# Patient Record
Sex: Female | Born: 1970
Health system: Southern US, Community
[De-identification: ages and names within clinical notes are randomized; demographics above are authoritative.]

## PROBLEM LIST (undated history)

## (undated) DIAGNOSIS — M349 Systemic sclerosis, unspecified: Secondary | ICD-10-CM

## (undated) DIAGNOSIS — I639 Cerebral infarction, unspecified: Secondary | ICD-10-CM

## (undated) DIAGNOSIS — I10 Essential (primary) hypertension: Secondary | ICD-10-CM

## (undated) HISTORY — PX: CHOLECYSTECTOMY: SHX55

---

## 2009-03-13 ENCOUNTER — Emergency Department (HOSPITAL_BASED_OUTPATIENT_CLINIC_OR_DEPARTMENT_OTHER): Admission: EM | Admit: 2009-03-13 | Discharge: 2009-03-13 | Payer: Self-pay | Admitting: Emergency Medicine

## 2009-03-13 ENCOUNTER — Ambulatory Visit: Payer: Self-pay | Admitting: Diagnostic Radiology

## 2011-10-19 ENCOUNTER — Encounter (HOSPITAL_BASED_OUTPATIENT_CLINIC_OR_DEPARTMENT_OTHER): Payer: Self-pay | Admitting: *Deleted

## 2011-10-19 ENCOUNTER — Emergency Department (HOSPITAL_BASED_OUTPATIENT_CLINIC_OR_DEPARTMENT_OTHER)
Admission: EM | Admit: 2011-10-19 | Discharge: 2011-10-20 | Disposition: A | Discharge status: Home / Self Care | Payer: BC Managed Care – PPO | Attending: Emergency Medicine | Admitting: Emergency Medicine

## 2011-10-19 DIAGNOSIS — Z79899 Other long term (current) drug therapy: Secondary | ICD-10-CM | POA: Insufficient documentation

## 2011-10-19 DIAGNOSIS — I10 Essential (primary) hypertension: Secondary | ICD-10-CM | POA: Insufficient documentation

## 2011-10-19 DIAGNOSIS — R609 Edema, unspecified: Secondary | ICD-10-CM | POA: Insufficient documentation

## 2011-10-19 DIAGNOSIS — IMO0002 Reserved for concepts with insufficient information to code with codable children: Secondary | ICD-10-CM | POA: Insufficient documentation

## 2011-10-19 DIAGNOSIS — G44209 Tension-type headache, unspecified, not intractable: Secondary | ICD-10-CM | POA: Insufficient documentation

## 2011-10-19 DIAGNOSIS — M349 Systemic sclerosis, unspecified: Secondary | ICD-10-CM | POA: Insufficient documentation

## 2011-10-19 DIAGNOSIS — E119 Type 2 diabetes mellitus without complications: Secondary | ICD-10-CM | POA: Insufficient documentation

## 2011-10-19 HISTORY — DX: Essential (primary) hypertension: I10

## 2011-10-19 HISTORY — DX: Systemic sclerosis, unspecified: M34.9

## 2011-10-19 MED ORDER — DIPHENHYDRAMINE HCL 50 MG/ML IJ SOLN
25.0000 mg | Freq: Once | INTRAMUSCULAR | Status: AC
  Administered 2011-10-20: 25 mg via INTRAVENOUS
  Filled 2011-10-19: qty 1

## 2011-10-19 MED ORDER — METOCLOPRAMIDE HCL 5 MG/ML IJ SOLN
10.0000 mg | Freq: Once | INTRAMUSCULAR | Status: AC
  Administered 2011-10-20: 10 mg via INTRAVENOUS
  Filled 2011-10-19: qty 2

## 2011-10-19 NOTE — ED Notes (Signed)
CBG 171 reported this to MD

## 2011-10-19 NOTE — ED Notes (Signed)
Pt seen at [facility] last night for nosebleed- today c/o headache and elevated b/p

## 2011-10-19 NOTE — ED Provider Notes (Signed)
History  This chart was scribed for Dione Booze, MD by Cherlynn Perches. The patient was seen in room MH01/MH01. Patient's care was started at 2327.  CSN: [id number]  Arrival date & time 10/19/11  2327   First MD Initiated Contact with Patient 10/19/11 2340      Chief Complaint  Patient presents with  . Headache    (Consider location/radiation/quality/duration/timing/severity/associated sxs/prior treatment) HPI  Jane Sharp is a 41 y.o. female with a h/o HTN, sleroderma, and diabetes who presents to the Emergency Department complaining of 24 hours of gradual onset, gradually worsening, constant, "6/10" headache localized to the left front of the head and described as achy with associated neck pain. Pt reports that she was sleeping last night and felt congested. When she got up to blow her nose, she noticed a blood clot and began having a bloody nose. She visited [facility] ED for treatment. After bleeding was controlled, pt states that she developed a headache, which has been worsening over the day today. Pt has not taken any pain medications for HA. Pt states that she is non-compliant with her HTN and diabetes management. Pt reports that she takes her HTN medication about 3 times/week and checks her glucose 1 time/week. Pt denies blurred vision, nausea, vomiting, diaphoresis, and fever.   PCP - Dr. Caffie Damme in [place]  Past Medical History  Diagnosis Date  . Hypertension   . Diabetes mellitus   . Scleroderma     Past Surgical History  Procedure Date  . Cholecystectomy     No family history on file.  History  Substance Use Topics  . Smoking status: Never Smoker   . Smokeless tobacco: Never Used  . Alcohol Use: No    OB History    Grav Para Term Preterm Abortions TAB SAB Ect Mult Living                  Review of Systems  Constitutional: Negative for fever and chills.  HENT: Positive for nosebleeds (last night) and neck pain. Negative for sore throat.     Respiratory: Negative for cough and shortness of breath.   Cardiovascular: Negative for chest pain.  Gastrointestinal: Negative for nausea, vomiting and diarrhea.  Genitourinary: Negative for dysuria and hematuria.  Neurological: Positive for headaches. Negative for seizures and syncope.  All other systems reviewed and are negative.    Allergies  Review of patient's allergies indicates no known allergies.  Home Medications   Current Outpatient Rx  Name Route Sig Dispense Refill  . ATORVASTATIN CALCIUM 20 MG PO TABS Oral Take 20 mg by mouth.    Marland Kitchen LISINOPRIL-HYDROCHLOROTHIAZIDE 20-12.5 MG PO TABS Oral Take 1 tablet by mouth.    . METFORMIN HCL 500 MG PO TABS Oral Take 500 mg by mouth.    Marland Kitchen PREDNISONE 2.5 MG PO TABS Oral Take 2.5 mg by mouth.    Marland Kitchen SITAGLIPTIN PHOSPHATE 100 MG PO TABS Oral Take 100 mg by mouth.      Triage Vitals: BP 177/111  Pulse 89  Temp(Src) 99.3 F (37.4 C) (Oral)  Resp 18  Ht 5' (1.524 m)  Wt 285 lb (129.275 kg)  BMI 55.66 kg/m2  SpO2 99%  LMP 09/19/2011  Physical Exam  Nursing note and vitals reviewed. Constitutional: She is oriented to person, place, and time. She appears well-developed and well-nourished.  HENT:  Head: Normocephalic and atraumatic.       Tenderness of left temporalis and paracervical muscles   Eyes: Conjunctivae  and EOM are normal. Pupils are equal, round, and reactive to light. No scleral icterus.       funduscopic exam is normal  Neck: Normal range of motion. Neck supple.  Cardiovascular: Normal rate and regular rhythm.   Pulmonary/Chest: Effort normal. No respiratory distress.  Abdominal: Soft. Bowel sounds are normal.  Musculoskeletal: Normal range of motion. She exhibits edema (1+ edema).  Neurological: She is alert and oriented to person, place, and time.  Skin: Skin is warm and dry.       Sclerotic changes of scleroderma   Psychiatric: She has a normal mood and affect. Her behavior is normal.    ED Course   Procedures (including critical care time)  DIAGNOSTIC STUDIES: Oxygen Saturation is 99% on room air, normal by my interpretation.    COORDINATION OF CARE: 11:49PM - Will get IV started and try to get HA under control. Advised pt of importance of compliance to medications and treatments for HTN and diabetes. Pt agrees with plan.    Results for orders placed during the hospital encounter of 10/19/11  CBC      Component Value Range   WBC 10.2  4.0 - 10.5 (K/uL)   RBC 5.27 (*) 3.87 - 5.11 (MIL/uL)   Hemoglobin 12.4  12.0 - 15.0 (g/dL)   HCT 86.5  78.4 - 69.6 (%)   MCV 72.5 (*) 78.0 - 100.0 (fL)   MCH 23.5 (*) 26.0 - 34.0 (pg)   MCHC 32.5  30.0 - 36.0 (g/dL)   RDW 29.5 (*) 28.4 - 15.5 (%)   Platelets 332  150 - 400 (K/uL)  DIFFERENTIAL      Component Value Range   Neutrophils Relative 35 (*) 43 - 77 (%)   Neutro Abs 3.5  1.7 - 7.7 (K/uL)   Lymphocytes Relative 55 (*) 12 - 46 (%)   Lymphs Abs 5.6 (*) 0.7 - 4.0 (K/uL)   Monocytes Relative 7  3 - 12 (%)   Monocytes Absolute 0.7  0.1 - 1.0 (K/uL)   Eosinophils Relative 4  0 - 5 (%)   Eosinophils Absolute 0.4  0.0 - 0.7 (K/uL)   Basophils Relative 0  0 - 1 (%)   Basophils Absolute 0.0  0.0 - 0.1 (K/uL)  BASIC METABOLIC PANEL      Component Value Range   Sodium 135  135 - 145 (mEq/L)   Potassium 4.2  3.5 - 5.1 (mEq/L)   Chloride 99  96 - 112 (mEq/L)   CO2 28  19 - 32 (mEq/L)   Glucose, Bld 171 (*) 70 - 99 (mg/dL)   BUN 12  6 - 23 (mg/dL)   Creatinine, Ser 1.32  0.50 - 1.10 (mg/dL)   Calcium 9.3  8.4 - 44.0 (mg/dL)   GFR calc non Af Amer 78 (*) >90 (mL/min)   GFR calc Af Amer >90  >90 (mL/min)    1. Tension headache       MDM  Headache which seems most likely to be muscle contraction headache. Hypertension. She'll be given a headache cocktail and reassessed.  After IV metoclopramide and IV diphenhydramine, headache is much better and blood pressure was back to normal. Patient was encouraged to monitor her blood pressure  and blood sugar at home. She is sent home with a prescription for metoclopramide.      I personally performed the services described in this documentation, which was scribed in my presence. The recorded information has been reviewed and considered.  Dione Booze, MD 10/20/11 [phone]

## 2011-10-20 LAB — GLUCOSE, CAPILLARY: Glucose-Capillary: 171 mg/dL — ABNORMAL HIGH (ref 70–99)

## 2011-10-20 LAB — BASIC METABOLIC PANEL
Chloride: 99 mEq/L (ref 96–112)
GFR calc non Af Amer: 78 mL/min — ABNORMAL LOW (ref >90)
Glucose, Bld: 171 mg/dL — ABNORMAL HIGH (ref 70–99)

## 2011-10-20 LAB — CBC
HCT: 38.2 % (ref 36.0–46.0)
MCH: 23.5 pg — ABNORMAL LOW (ref 26.0–34.0)
MCHC: 32.5 g/dL (ref 30.0–36.0)
MCV: 72.5 fL — ABNORMAL LOW (ref 78.0–100.0)
Platelets: 332 10*3/uL (ref 150–400)
RBC: 5.27 MIL/uL — ABNORMAL HIGH (ref 3.87–5.11)
RDW: 16.7 % — ABNORMAL HIGH (ref 11.5–15.5)

## 2011-10-20 LAB — DIFFERENTIAL
Eosinophils Absolute: 0.4 10*3/uL (ref 0.0–0.7)
Eosinophils Relative: 4 % (ref 0–5)
Lymphs Abs: 5.6 10*3/uL — ABNORMAL HIGH (ref 0.7–4.0)
Monocytes Absolute: 0.7 10*3/uL (ref 0.1–1.0)
Monocytes Relative: 7 % (ref 3–12)
Neutro Abs: 3.5 10*3/uL (ref 1.7–7.7)
Neutrophils Relative %: 35 % — ABNORMAL LOW (ref 43–77)

## 2011-10-20 MED ORDER — METOCLOPRAMIDE HCL 10 MG PO TABS: 10.0000 mg | ORAL_TABLET | Freq: Four times a day (QID) | ORAL | Status: DC | PRN

## 2011-10-20 NOTE — Discharge Instructions (Signed)
Monitor your blood sugar and her blood pressure at home on a regular basis. Bring a log of your blood pressure and blood sugar to your Dr. when you ago for visits.  Tension Headache (Muscle Contraction Headache) Tension headache is one of the most common causes of head pain. These headaches are usually felt as a pain over the top of your head and back of your neck. Stress, anxiety, and depression are common triggers for these headaches. Tension headaches are not life-threatening and will not lead to other types of headaches. Tension headaches can often be diagnosed by taking a history from the patient and a physical exam. Sometimes, further lab and x-ray studies are used to confirm the diagnosis. Your caregiver can advise you on how to get help solving problems that cause anxiety or stress. Antidepressants can be prescribed if depression is a problem. HOME CARE INSTRUCTIONS   If testing was done, call for your results. Remember, it is your responsibility to get the results of all testing. Do not assume everything is fine because you do not hear from your caregiver.   Only take over-the-counter or prescription medicines for pain, discomfort, or fever as directed by your caregiver.   Biofeedback, massage, or other relaxation techniques may be helpful.   Ice packs or heat to the head and neck can be used. Use these three to four times per day or as needed.   Physical therapy may be a useful addition to treatment.   If headaches continue, even with therapy, you may need to think about lifestyle changes.   Avoid excessive use of pain killers, as rebound headaches can occur.  SEEK MEDICAL CARE IF:   You develop problems with medications prescribed.   You do not respond or get no relief from medications.   You have a change from the usual headache.   You develop nausea (feeling sick to your stomach) or vomiting.  SEEK IMMEDIATE MEDICAL CARE IF:   Your headache becomes severe.   You have an  unexplained oral temperature above 102 F (38.9 C).   You develop a stiff neck.   You have loss of vision.   You have muscular weakness.   You have loss of muscular control.   You develop severe symptoms different from your first symptoms.   You start losing your balance or have trouble walking.   You feel faint or pass out.  MAKE SURE YOU:   Understand these instructions.   Will watch your condition.   Will get help right away if you are not doing well or get worse.  Document Released: 05/11/2005 Document Revised: 04/30/2011 Document Reviewed: 12/29/2007 [facility] Patient Information 2012 [place], [place].  Metoclopramide tablets What is this medicine? METOCLOPRAMIDE (met oh kloe PRA mide) is used to treat the symptoms of gastroesophageal reflux disease (GERD) like heartburn. It is also used to treat people with slow emptying of the stomach and intestinal tract. This medicine may be used for other purposes; ask your health care provider or pharmacist if you have questions. What should I tell my health care provider before I take this medicine? They need to know if you have any of these conditions: -breast cancer -depression -diabetes -heart failure -high blood pressure -kidney disease -liver disease -Parkinson's disease or a movement disorder -pheochromocytoma -seizures -stomach obstruction, bleeding, or perforation -an unusual or allergic reaction to metoclopramide, procainamide, sulfites, other medicines, foods, dyes, or preservatives -pregnant or trying to get pregnant -breast-feeding How should I use this medicine? Take this  medicine by mouth with a glass of water. Follow the directions on the prescription label. Take this medicine on an empty stomach, about 30 minutes before eating. Take your doses at regular intervals. Do not take your medicine more often than directed. Do not stop taking except on the advice of your doctor or health care professional. A special  MedGuide will be given to you by the pharmacist with each prescription and refill. Be sure to read this information carefully each time. Talk to your pediatrician regarding the use of this medicine in children. Special care may be needed. Overdosage: If you think you have taken too much of this medicine contact a poison control center or emergency room at once. NOTE: This medicine is only for you. Do not share this medicine with others. What if I miss a dose? If you miss a dose, take it as soon as you can. If it is almost time for your next dose, take only that dose. Do not take double or extra doses. What may interact with this medicine? -acetaminophen -cyclosporine -digoxin -medicines for blood pressure -medicines for diabetes, including insulin -medicines for hay fever and other allergies -medicines for depression, especially an Monoamine Oxidase Inhibitor (MAOI) -medicines for Parkinson's disease, like levodopa -medicines for sleep or for pain -tetracycline This list may not describe all possible interactions. Give your health care provider a list of all the medicines, herbs, non-prescription drugs, or dietary supplements you use. Also tell them if you smoke, drink alcohol, or use illegal drugs. Some items may interact with your medicine. What should I watch for while using this medicine? It may take a few weeks for your stomach condition to start to get better. However, do not take this medicine for longer than 12 weeks. The longer you take this medicine, and the more you take it, the greater your chances are of developing serious side effects. If you are an elderly patient, a female patient, or you have diabetes, you may be at an increased risk for side effects from this medicine. Contact your doctor immediately if you start having movements you cannot control such as lip smacking, rapid movements of the tongue, involuntary or uncontrollable movements of the eyes, head, arms and legs, or  muscle twitches and spasms. Patients and their families should watch out for worsening depression or thoughts of suicide. Also watch out for any sudden or severe changes in feelings such as feeling anxious, agitated, panicky, irritable, hostile, aggressive, impulsive, severely restless, overly excited and hyperactive, or not being able to sleep. If this happens, especially at the beginning of treatment or after a change in dose, call your doctor. Do not treat yourself for high fever. Ask your doctor or health care professional for advice. You may get drowsy or dizzy. Do not drive, use machinery, or do anything that needs mental alertness until you know how this drug affects you. Do not stand or sit up quickly, especially if you are an older patient. This reduces the risk of dizzy or fainting spells. Alcohol can make you more drowsy and dizzy. Avoid alcoholic drinks. What side effects may I notice from receiving this medicine? Side effects that you should report to your doctor or health care professional as soon as possible: -allergic reactions like skin rash, itching or hives, swelling of the face, lips, or tongue -abnormal production of milk in females -breast enlargement in both males and females -change in the way you walk -difficulty moving, speaking or swallowing -drooling, lip smacking, or rapid  movements of the tongue -excessive sweating -fever -involuntary or uncontrollable movements of the eyes, head, arms and legs -irregular heartbeat or palpitations -muscle twitches and spasms -unusually weak or tired Side effects that usually do not require medical attention (report to your doctor or health care professional if they continue or are bothersome): -change in sex drive or performance -depressed mood -diarrhea -difficulty sleeping -headache -menstrual changes -restless or nervous This list may not describe all possible side effects. Call your doctor for medical advice about side  effects. You may report side effects to FDA at 1-800-FDA-1088. Where should I keep my medicine? Keep out of the reach of children. Store at room temperature between 20 and 25 degrees C (68 and 77 degrees F). Protect from light. Keep container tightly closed. Throw away any unused medicine after the expiration date. NOTE: This sheet is a summary. It may not cover all possible information. If you have questions about this medicine, talk to your doctor, pharmacist, or health care provider.  2012, [employer]vier/[employer] Standard. (01/04/2008 4:30:05 PM)

## 2011-10-20 NOTE — ED Notes (Signed)
Pt c/o upper part of mouth in back tingling and stomach aching pass medication given.

## 2013-04-14 ENCOUNTER — Emergency Department (HOSPITAL_BASED_OUTPATIENT_CLINIC_OR_DEPARTMENT_OTHER)
Admission: EM | Admit: 2013-04-14 | Discharge: 2013-04-14 | Disposition: A | Discharge status: Other Acute Inpatient Hospital | Payer: BC Managed Care – PPO | Attending: Emergency Medicine | Admitting: Emergency Medicine

## 2013-04-14 ENCOUNTER — Encounter (HOSPITAL_BASED_OUTPATIENT_CLINIC_OR_DEPARTMENT_OTHER): Payer: Self-pay | Admitting: Emergency Medicine

## 2013-04-14 ENCOUNTER — Emergency Department (HOSPITAL_BASED_OUTPATIENT_CLINIC_OR_DEPARTMENT_OTHER): Payer: BC Managed Care – PPO

## 2013-04-14 DIAGNOSIS — Z79899 Other long term (current) drug therapy: Secondary | ICD-10-CM | POA: Insufficient documentation

## 2013-04-14 DIAGNOSIS — I10 Essential (primary) hypertension: Secondary | ICD-10-CM | POA: Insufficient documentation

## 2013-04-14 DIAGNOSIS — I639 Cerebral infarction, unspecified: Secondary | ICD-10-CM

## 2013-04-14 DIAGNOSIS — I6350 Cerebral infarction due to unspecified occlusion or stenosis of unspecified cerebral artery: Secondary | ICD-10-CM | POA: Insufficient documentation

## 2013-04-14 DIAGNOSIS — IMO0002 Reserved for concepts with insufficient information to code with codable children: Secondary | ICD-10-CM | POA: Insufficient documentation

## 2013-04-14 DIAGNOSIS — E669 Obesity, unspecified: Secondary | ICD-10-CM | POA: Insufficient documentation

## 2013-04-14 DIAGNOSIS — E119 Type 2 diabetes mellitus without complications: Secondary | ICD-10-CM | POA: Insufficient documentation

## 2013-04-14 DIAGNOSIS — M349 Systemic sclerosis, unspecified: Secondary | ICD-10-CM | POA: Insufficient documentation

## 2013-04-14 LAB — CBC WITH DIFFERENTIAL/PLATELET
Basophils Absolute: 0 10*3/uL (ref 0.0–0.1)
Basophils Relative: 0 % (ref 0–1)
Eosinophils Relative: 4 % (ref 0–5)
Lymphs Abs: 4 10*3/uL (ref 0.7–4.0)
MCH: 23.3 pg — ABNORMAL LOW (ref 26.0–34.0)
MCHC: 31.3 g/dL (ref 30.0–36.0)
Neutro Abs: 4.3 10*3/uL (ref 1.7–7.7)
Neutrophils Relative %: 46 % (ref 43–77)
RDW: 16.1 % — ABNORMAL HIGH (ref 11.5–15.5)

## 2013-04-14 LAB — COMPREHENSIVE METABOLIC PANEL
ALT: 14 U/L (ref 0–35)
Alkaline Phosphatase: 73 U/L (ref 39–117)
CO2: 30 mEq/L (ref 19–32)
Calcium: 9.5 mg/dL (ref 8.4–10.5)
Chloride: 97 mEq/L (ref 96–112)
GFR calc Af Amer: >90 mL/min (ref >90)
GFR calc non Af Amer: 88 mL/min — ABNORMAL LOW (ref >90)
Potassium: 3.7 mEq/L (ref 3.5–5.1)
Sodium: 137 mEq/L (ref 135–145)

## 2013-04-14 LAB — URINALYSIS, ROUTINE W REFLEX MICROSCOPIC
Hgb urine dipstick: NEGATIVE
Ketones, ur: NEGATIVE mg/dL
Nitrite: NEGATIVE
Urobilinogen, UA: 0.2 mg/dL (ref 0.0–1.0)

## 2013-04-14 LAB — URINE MICROSCOPIC-ADD ON

## 2013-04-14 MED ORDER — ASPIRIN 81 MG PO CHEW
324.0000 mg | CHEWABLE_TABLET | Freq: Once | ORAL | Status: AC
  Administered 2013-04-14: 324 mg via ORAL
  Filled 2013-04-14: qty 4

## 2013-04-14 NOTE — ED Notes (Signed)
Patient transported to CT 
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
## 2013-04-14 NOTE — ED Notes (Signed)
Patient is removing  Hair pins from hair before she has cat scan.

## 2013-04-14 NOTE — ED Notes (Signed)
Assigned to bed 606 @ [facility] per nursing supervisor Jasmine December, RN notified, [facility] called for transport.

## 2013-04-14 NOTE — ED Notes (Signed)
Pt ambulatory to BR with standby assist.  Pt voided but dropped the specimen cup.  No urine collected at this time.

## 2013-04-14 NOTE — ED Notes (Signed)
Pt ambulated to bathroom with standby assist only, u/a collected, [facility] at bedside awaiting transport

## 2013-04-14 NOTE — ED Notes (Signed)
Confusion since yesterday. Drooping of her left eye. The right side of her face looks swollen to me. She just got back from a cruise to the [place]. No fever.

## 2013-04-16 LAB — URINE CULTURE: Colony Count: 75000

## 2014-01-01 ENCOUNTER — Emergency Department (HOSPITAL_BASED_OUTPATIENT_CLINIC_OR_DEPARTMENT_OTHER)
Admission: EM | Admit: 2014-01-01 | Discharge: 2014-01-01 | Disposition: A | Discharge status: Home / Self Care | Payer: BC Managed Care – PPO | Attending: Emergency Medicine | Admitting: Emergency Medicine

## 2014-01-01 ENCOUNTER — Encounter (HOSPITAL_BASED_OUTPATIENT_CLINIC_OR_DEPARTMENT_OTHER): Payer: Self-pay | Admitting: Emergency Medicine

## 2014-01-01 ENCOUNTER — Emergency Department (HOSPITAL_BASED_OUTPATIENT_CLINIC_OR_DEPARTMENT_OTHER): Payer: BC Managed Care – PPO

## 2014-01-01 DIAGNOSIS — K5732 Diverticulitis of large intestine without perforation or abscess without bleeding: Secondary | ICD-10-CM | POA: Insufficient documentation

## 2014-01-01 DIAGNOSIS — Z79899 Other long term (current) drug therapy: Secondary | ICD-10-CM | POA: Diagnosis not present

## 2014-01-01 DIAGNOSIS — Z8673 Personal history of transient ischemic attack (TIA), and cerebral infarction without residual deficits: Secondary | ICD-10-CM | POA: Insufficient documentation

## 2014-01-01 DIAGNOSIS — E119 Type 2 diabetes mellitus without complications: Secondary | ICD-10-CM | POA: Insufficient documentation

## 2014-01-01 DIAGNOSIS — T8339XA Other mechanical complication of intrauterine contraceptive device, initial encounter: Secondary | ICD-10-CM | POA: Insufficient documentation

## 2014-01-01 DIAGNOSIS — Z7982 Long term (current) use of aspirin: Secondary | ICD-10-CM | POA: Diagnosis not present

## 2014-01-01 DIAGNOSIS — Y849 Medical procedure, unspecified as the cause of abnormal reaction of the patient, or of later complication, without mention of misadventure at the time of the procedure: Secondary | ICD-10-CM | POA: Diagnosis not present

## 2014-01-01 DIAGNOSIS — T8389XA Other specified complication of genitourinary prosthetic devices, implants and grafts, initial encounter: Secondary | ICD-10-CM

## 2014-01-01 DIAGNOSIS — R1012 Left upper quadrant pain: Secondary | ICD-10-CM | POA: Diagnosis present

## 2014-01-01 DIAGNOSIS — Z872 Personal history of diseases of the skin and subcutaneous tissue: Secondary | ICD-10-CM | POA: Insufficient documentation

## 2014-01-01 DIAGNOSIS — I10 Essential (primary) hypertension: Secondary | ICD-10-CM | POA: Insufficient documentation

## 2014-01-01 DIAGNOSIS — T8332XA Displacement of intrauterine contraceptive device, initial encounter: Secondary | ICD-10-CM

## 2014-01-01 HISTORY — DX: Cerebral infarction, unspecified: I63.9

## 2014-01-01 LAB — URINALYSIS, ROUTINE W REFLEX MICROSCOPIC
Bilirubin Urine: NEGATIVE
GLUCOSE, UA: NEGATIVE mg/dL
Ketones, ur: NEGATIVE mg/dL
Nitrite: NEGATIVE
PH: 5.5 (ref 5.0–8.0)
Protein, ur: NEGATIVE mg/dL
SPECIFIC GRAVITY, URINE: 1.011 (ref 1.005–1.030)
Urobilinogen, UA: 1 mg/dL (ref 0.0–1.0)

## 2014-01-01 LAB — BASIC METABOLIC PANEL
Anion gap: 16 — ABNORMAL HIGH (ref 5–15)
BUN: 10 mg/dL (ref 6–23)
CHLORIDE: 96 meq/L (ref 96–112)
CO2: 22 meq/L (ref 19–32)
Calcium: 9.4 mg/dL (ref 8.4–10.5)
Creatinine, Ser: 0.9 mg/dL (ref 0.50–1.10)
GFR calc Af Amer: 88 mL/min — ABNORMAL LOW (ref >90)
GFR calc non Af Amer: 76 mL/min — ABNORMAL LOW (ref >90)
GLUCOSE: 116 mg/dL — AB (ref 70–99)
Potassium: 3.9 mEq/L (ref 3.7–5.3)
SODIUM: 134 meq/L — AB (ref 137–147)

## 2014-01-01 LAB — CBC WITH DIFFERENTIAL/PLATELET
Basophils Absolute: 0 10*3/uL (ref 0.0–0.1)
Basophils Relative: 0 % (ref 0–1)
Eosinophils Absolute: 0.1 10*3/uL (ref 0.0–0.7)
Eosinophils Relative: 1 % (ref 0–5)
HEMATOCRIT: 37.6 % (ref 36.0–46.0)
Hemoglobin: 12.3 g/dL (ref 12.0–15.0)
LYMPHS PCT: 34 % (ref 12–46)
Lymphs Abs: 4.4 10*3/uL — ABNORMAL HIGH (ref 0.7–4.0)
MCH: 24.2 pg — ABNORMAL LOW (ref 26.0–34.0)
MCHC: 32.7 g/dL (ref 30.0–36.0)
MCV: 73.9 fL — AB (ref 78.0–100.0)
MONO ABS: 1.2 10*3/uL — AB (ref 0.1–1.0)
Monocytes Relative: 9 % (ref 3–12)
NEUTROS ABS: 7.3 10*3/uL (ref 1.7–7.7)
Neutrophils Relative %: 56 % (ref 43–77)
Platelets: 355 10*3/uL (ref 150–400)
RBC: 5.09 MIL/uL (ref 3.87–5.11)
RDW: 16.1 % — ABNORMAL HIGH (ref 11.5–15.5)
WBC: 12.9 10*3/uL — AB (ref 4.0–10.5)

## 2014-01-01 LAB — URINE MICROSCOPIC-ADD ON

## 2014-01-01 LAB — CBG MONITORING, ED: GLUCOSE-CAPILLARY: 111 mg/dL — AB (ref 70–99)

## 2014-01-01 MED ORDER — IOHEXOL 300 MG/ML  SOLN
100.0000 mL | Freq: Once | INTRAMUSCULAR | Status: AC | PRN
  Administered 2014-01-01: 100 mL via INTRAVENOUS

## 2014-01-01 MED ORDER — HYDROCODONE-ACETAMINOPHEN 5-325 MG PO TABS: 1.0000 | ORAL_TABLET | Freq: Four times a day (QID) | ORAL | Status: DC | PRN

## 2014-01-01 MED ORDER — FENTANYL CITRATE 0.05 MG/ML IJ SOLN
100.0000 ug | INTRAMUSCULAR | Status: DC | PRN
  Administered 2014-01-01 (×2): 100 ug via INTRAVENOUS
  Filled 2014-01-01 (×2): qty 2

## 2014-01-01 MED ORDER — IOHEXOL 300 MG/ML  SOLN
50.0000 mL | Freq: Once | INTRAMUSCULAR | Status: AC | PRN
  Administered 2014-01-01: 50 mL via ORAL

## 2014-01-01 MED ORDER — ERTAPENEM SODIUM 1 G IJ SOLR
1.0000 g | Freq: Once | INTRAMUSCULAR | Status: AC
  Administered 2014-01-01: 1 g via INTRAVENOUS
  Filled 2014-01-01: qty 1

## 2014-01-01 MED ORDER — ONDANSETRON HCL 4 MG/2ML IJ SOLN
4.0000 mg | Freq: Once | INTRAMUSCULAR | Status: AC | PRN
  Administered 2014-01-01: 4 mg via INTRAVENOUS
  Filled 2014-01-01: qty 2

## 2014-01-01 MED ORDER — METRONIDAZOLE 500 MG PO TABS: ORAL_TABLET | ORAL | Status: DC

## 2014-01-01 MED ORDER — SODIUM CHLORIDE 0.9 % IV SOLN
INTRAVENOUS | Status: DC
  Administered 2014-01-01: 01:00:00 via INTRAVENOUS

## 2014-01-01 MED ORDER — CIPROFLOXACIN HCL 500 MG PO TABS: ORAL_TABLET | ORAL | Status: DC

## 2014-01-01 MED ORDER — SODIUM CHLORIDE 0.9 % IV SOLN
INTRAVENOUS | Status: AC
  Filled 2014-01-01: qty 1

## 2014-01-01 NOTE — ED Provider Notes (Signed)
CSN: [id number]     Arrival date & time 01/01/14  0018 History  This chart was scribed for Hanley Seamen, MD by Luisa Dago, ED Scribe. This patient was seen in room MH10/MH10 and the patient's care was started at 12:41 AM.    Chief Complaint  Patient presents with  . Abdominal Pain   The history is provided by the patient. No language interpreter was used.   HPI Comments: Jane Sharp is a 43 y.o. female with a history of CVA, cholecystectomy, and scleroderma presents to the Emergency Department complaining of constant, but waxing and waning, left upper quadrant abdominal pain that started approximately 3 days ago. Pt states that today she had one episode of emesis and one episode of bladder incontinence. She describes the pain as "sharp" in nature and is exacerbated by certain movements. At its worst she rates her pain as a 8/10. She reports associated subjective fever. Temperature on arrival was 100 F. Pt is on her menses. Denies diarrhea, constipation, chills, congestion, SOB, or headache.    Past Medical History  Diagnosis Date  . Hypertension   . Diabetes mellitus   . Scleroderma   . Stroke    Past Surgical History  Procedure Laterality Date  . Cholecystectomy     History reviewed. No pertinent family history. History  Substance Use Topics  . Smoking status: Never Smoker   . Smokeless tobacco: Never Used  . Alcohol Use: No   OB History   Grav Para Term Preterm Abortions TAB SAB Ect Mult Living                 Review of Systems A complete 10 system review of systems was obtained and all systems are negative except as noted in the HPI and PMH.    Allergies  Review of patient's allergies indicates no known allergies.  Home Medications   Prior to Admission medications   Medication Sig Start Date End Date Taking? Authorizing Provider  aspirin 325 MG tablet Take 325 mg by mouth daily.   Yes Historical Provider, MD  atorvastatin (LIPITOR) 20 MG tablet Take 20 mg by  mouth.   Yes Historical Provider, MD  glucose blood test strip 1 each by Other route as needed for other. Use as instructed   Yes Historical Provider, MD  Liraglutide (VICTOZA) 18 MG/3ML SOPN Inject into the skin.   Yes Historical Provider, MD  lisinopril-hydrochlorothiazide (PRINZIDE,ZESTORETIC) 20-12.5 MG per tablet Take 1 tablet by mouth.   Yes Historical Provider, MD  meloxicam (MOBIC) 15 MG tablet Take 15 mg by mouth daily.   Yes Historical Provider, MD  metFORMIN (GLUCOPHAGE) 500 MG tablet Take 500 mg by mouth.   Yes Historical Provider, MD  predniSONE (DELTASONE) 2.5 MG tablet Take 2.5 mg by mouth.   Yes Historical Provider, MD  metoCLOPramide (REGLAN) 10 MG tablet Take 1 tablet (10 mg total) by mouth every 6 (six) hours as needed (nausea or headache). 10/20/11 10/30/11  Dione Booze, MD  saxagliptin HCl (ONGLYZA) 2.5 MG TABS tablet Take 5 mg by mouth daily.    Historical Provider, MD  sitaGLIPtin (JANUVIA) 100 MG tablet Take 100 mg by mouth.    Historical Provider, MD   BP 125/79  Pulse 123  Temp(Src) 100 F (37.8 C) (Oral)  Resp 22  SpO2 98%  LMP 12/29/2013  Physical Exam  Nursing note and vitals reviewed. General: Well-developed, well-nourished female in no acute distress; appearance consistent with age of record HENT: normocephalic; atraumatic Eyes: pupils  equal, round and reactive to light; extraocular muscles intact; left ptosis;  Neck: supple Heart: regular rate and rhythm; no murmurs, rubs or gallops Lungs: clear to auscultation bilaterally Abdomen: soft; nondistended; no masses or hepatosplenomegaly; bowel sounds present; left lateral tenderness GU: no CVA tenderness Extremities: No deformity; full range of motion; pulses normal Neurologic: Awake, alert and oriented; motor function intact in all extremities and symmetric; slight left facial droop Skin: Warm and dry; patchy hyperpigmentation with associated sclerosis Psychiatric: Normal mood and affect    ED Course   Procedures (including critical care time)  DIAGNOSTIC STUDIES: Oxygen Saturation is 98% on RA, normal by my interpretation.    COORDINATION OF CARE: 12:55 AM- Pt advised of plan for treatment and pt agrees.   MDM   Nursing notes and vitals signs, including pulse oximetry, reviewed.  Summary of this visit's results, reviewed by myself:  Labs:  Results for orders placed during the hospital encounter of 01/01/14 (from the past 24 hour(s))  URINALYSIS, ROUTINE W REFLEX MICROSCOPIC     Status: Abnormal   Collection Time    01/01/14 12:35 AM      Result Value Ref Range   Color, Urine RED (*) YELLOW   APPearance HAZY (*) CLEAR   Specific Gravity, Urine 1.011  1.005 - 1.030   pH 5.5  5.0 - 8.0   Glucose, UA NEGATIVE  NEGATIVE mg/dL   Hgb urine dipstick LARGE (*) NEGATIVE   Bilirubin Urine NEGATIVE  NEGATIVE   Ketones, ur NEGATIVE  NEGATIVE mg/dL   Protein, ur NEGATIVE  NEGATIVE mg/dL   Urobilinogen, UA 1.0  0.0 - 1.0 mg/dL   Nitrite NEGATIVE  NEGATIVE   Leukocytes, UA TRACE (*) NEGATIVE  URINE MICROSCOPIC-ADD ON     Status: Abnormal   Collection Time    01/01/14 12:35 AM      Result Value Ref Range   Squamous Epithelial / LPF FEW (*) RARE   WBC, UA 0-2  <3 WBC/hpf   RBC / HPF TOO NUMEROUS TO COUNT  <3 RBC/hpf   Bacteria, UA FEW (*) RARE  CBG MONITORING, ED     Status: Abnormal   Collection Time    01/01/14 12:40 AM      Result Value Ref Range   Glucose-Capillary 111 (*) 70 - 99 mg/dL  BASIC METABOLIC PANEL     Status: Abnormal   Collection Time    01/01/14 12:56 AM      Result Value Ref Range   Sodium 134 (*) 137 - 147 mEq/L   Potassium 3.9  3.7 - 5.3 mEq/L   Chloride 96  96 - 112 mEq/L   CO2 22  19 - 32 mEq/L   Glucose, Bld 116 (*) 70 - 99 mg/dL   BUN 10  6 - 23 mg/dL   Creatinine, Ser 1.61  0.50 - 1.10 mg/dL   Calcium 9.4  8.4 - 09.6 mg/dL   GFR calc non Af Amer 76 (*) >90 mL/min   GFR calc Af Amer 88 (*) >90 mL/min   Anion gap 16 (*) 5 - 15  CBC WITH  DIFFERENTIAL     Status: Abnormal   Collection Time    01/01/14 12:56 AM      Result Value Ref Range   WBC 12.9 (*) 4.0 - 10.5 K/uL   RBC 5.09  3.87 - 5.11 MIL/uL   Hemoglobin 12.3  12.0 - 15.0 g/dL   HCT 04.5  40.9 - 81.1 %   MCV 73.9 (*)  78.0 - 100.0 fL   MCH 24.2 (*) 26.0 - 34.0 pg   MCHC 32.7  30.0 - 36.0 g/dL   RDW 52.816.1 (*) 41.311.5 - 24.415.5 %   Platelets 355  150 - 400 K/uL   Neutrophils Relative % 56  43 - 77 %   Neutro Abs 7.3  1.7 - 7.7 K/uL   Lymphocytes Relative 34  12 - 46 %   Lymphs Abs 4.4 (*) 0.7 - 4.0 K/uL   Monocytes Relative 9  3 - 12 %   Monocytes Absolute 1.2 (*) 0.1 - 1.0 K/uL   Eosinophils Relative 1  0 - 5 %   Eosinophils Absolute 0.1  0.0 - 0.7 K/uL   Basophils Relative 0  0 - 1 %   Basophils Absolute 0.0  0.0 - 0.1 K/uL    Imaging Studies: Ct Abdomen Pelvis W Contrast  01/01/2014   CLINICAL DATA:  Left upper quadrant abdominal pain for 3 days.  EXAM: CT ABDOMEN AND PELVIS WITH CONTRAST  TECHNIQUE: Multidetector CT imaging of the abdomen and pelvis was performed using the standard protocol following bolus administration of intravenous contrast.  CONTRAST:  50mL OMNIPAQUE IOHEXOL 300 MG/ML SOLN, 100mL OMNIPAQUE IOHEXOL 300 MG/ML SOLN  COMPARISON:  None.  FINDINGS: The visualized lung bases are clear.  The liver and spleen are unremarkable in appearance. The patient is status post cholecystectomy, with clips noted at the gallbladder fossa. The pancreas and adrenal glands are unremarkable.  The kidneys are unremarkable in appearance. There is no evidence of hydronephrosis. No renal or ureteral stones are seen. No perinephric stranding is appreciated.  No free fluid is identified. The small bowel is unremarkable in appearance. The stomach is within normal limits. No acute vascular abnormalities are seen.  The appendix is normal in caliber, without evidence for appendicitis.  Focal soft tissue inflammation is noted along the proximal descending colon, with associated inflamed  diverticula and mild colonic wall thickening, compatible with acute diverticulitis.  There is also mild focal soft tissue inflammation about a diverticulum at the distal descending colon, with trace free fluid, compatible with a second site of acute diverticulitis. There is no evidence of perforation or abscess formation at this time.  The bladder is mildly distended and grossly unremarkable. The uterus is grossly normal in appearance. The intrauterine device is noted in abnormal alignment, seen about the level of the cervix, apparently extending into the myometrium. The ovaries are grossly symmetric; no suspicious adnexal masses are seen. No inguinal lymphadenopathy is seen.  No acute osseous abnormalities are identified.  IMPRESSION: 1. Two foci of acute diverticulitis, noted at the proximal descending colon and distal descending colon, with focal soft tissue inflammation and trace free fluid. Mild colonic wall thickening at the proximal descending colon. No evidence of perforation or abscess formation at this time. 2. Intrauterine device noted in abnormal alignment, seen about the level of the cervix, apparently extending into the myometrium. This should be repositioned.   Electronically Signed   By: Roanna RaiderJeffery  Chang M.D.   On: 01/01/2014 02:52   Patient advised of CT findings, including the need to see her Ob/Gyn about her IUD placement.   I personally performed the services described in this documentation, which was scribed in my presence. The recorded information has been reviewed and is accurate.    Carlisle BeersJohn L Mileena Rothenberger, MD 01/01/14 0300

## 2014-01-01 NOTE — ED Notes (Signed)
Pt was medicated for pain 6/10 by Raynelle FanningJulie, RN will monitor.

## 2014-01-01 NOTE — Discharge Instructions (Signed)
Diverticulitis Diverticulitis is inflammation or infection of small pouches in your colon that form when you have a condition called diverticulosis. The pouches in your colon are called diverticula. Your colon, or large intestine, is where water is absorbed and stool is formed. Complications of diverticulitis can include:  Bleeding.  Severe infection.  Severe pain.  Perforation of your colon.  Obstruction of your colon. CAUSES  Diverticulitis is caused by bacteria. Diverticulitis happens when stool becomes trapped in diverticula. This allows bacteria to grow in the diverticula, which can lead to inflammation and infection. RISK FACTORS People with diverticulosis are at risk for diverticulitis. Eating a diet that does not include enough fiber from fruits and vegetables may make diverticulitis more likely to develop. SYMPTOMS  Symptoms of diverticulitis may include:  Abdominal pain and tenderness. The pain is normally located on the left side of the abdomen, but may occur in other areas.  Fever and chills.  Bloating.  Cramping.  Nausea.  Vomiting.  Constipation.  Diarrhea.  Blood in your stool. DIAGNOSIS  Your health care provider will ask you about your medical history and do a physical exam. You may need to have tests done because many medical conditions can cause the same symptoms as diverticulitis. Tests may include:  Blood tests.  Urine tests.  Imaging tests of the abdomen, including X-rays and CT scans. When your condition is under control, your health care provider may recommend that you have a colonoscopy. A colonoscopy can show how severe your diverticula are and whether something else is causing your symptoms. TREATMENT  Most cases of diverticulitis are mild and can be treated at home. Treatment may include:  Taking over-the-counter pain medicines.  Following a clear liquid diet.  Taking antibiotic medicines by mouth for 7-10 days. More severe cases may  be treated at a hospital. Treatment may include:  Not eating or drinking.  Taking prescription pain medicine.  Receiving antibiotic medicines through an IV tube.  Receiving fluids and nutrition through an IV tube.  Surgery. HOME CARE INSTRUCTIONS   Follow your health care provider's instructions carefully.  Follow a full liquid diet or other diet as directed by your health care provider. After your symptoms improve, your health care provider may tell you to change your diet. He or she may recommend you eat a high-fiber diet. Fruits and vegetables are good sources of fiber. Fiber makes it easier to pass stool.  Take fiber supplements or probiotics as directed by your health care provider.  Only take medicines as directed by your health care provider.  Keep all your follow-up appointments. SEEK IMMEDIATE MEDICAL CARE IF:   Your pain becomes worse.  Your symptoms do not get better.  Your symptoms suddenly get worse.  You have a fever.  You have repeated vomiting.  You have bloody or black, tarry stools. MAKE SURE YOU:   Understand these instructions.  Will watch your condition.  Will get help right away if you are not doing well or get worse. Document Released: 02/18/2005 Document Revised: 05/16/2013 Document Reviewed: 04/05/2013 [facility] Patient Information 2015 [employer] [place]. This information is not intended to replace advice given to you by your health care provider. Make sure you discuss any questions you have with your health care provider.

## 2014-01-01 NOTE — ED Notes (Signed)
MD with pt  

## 2014-01-01 NOTE — ED Notes (Signed)
Pt had incontinent episode x 1 today.

## 2014-01-01 NOTE — ED Notes (Signed)
I took CBG and got result of 111 mg./dcltr.

## 2014-01-01 NOTE — ED Notes (Signed)
I notified Dr. Read DriversMolpus of patient BP of 103/54. Patient stated pain score of 4/10, I notified nurse Channin. t

## 2014-01-01 NOTE — ED Notes (Signed)
Patient transported to X-ray 

## 2014-01-01 NOTE — ED Notes (Signed)
Left lower abd pain for few days, denies dysuria but some urinary frequency

## 2015-12-19 ENCOUNTER — Emergency Department (HOSPITAL_BASED_OUTPATIENT_CLINIC_OR_DEPARTMENT_OTHER): Payer: BC Managed Care – PPO

## 2015-12-19 ENCOUNTER — Encounter (HOSPITAL_BASED_OUTPATIENT_CLINIC_OR_DEPARTMENT_OTHER): Payer: Self-pay | Admitting: Emergency Medicine

## 2015-12-19 ENCOUNTER — Emergency Department (HOSPITAL_BASED_OUTPATIENT_CLINIC_OR_DEPARTMENT_OTHER)
Admission: EM | Admit: 2015-12-19 | Discharge: 2015-12-19 | Disposition: A | Discharge status: Home / Self Care | Payer: BC Managed Care – PPO | Attending: Emergency Medicine | Admitting: Emergency Medicine

## 2015-12-19 ENCOUNTER — Other Ambulatory Visit: Payer: Self-pay

## 2015-12-19 DIAGNOSIS — Z8673 Personal history of transient ischemic attack (TIA), and cerebral infarction without residual deficits: Secondary | ICD-10-CM | POA: Insufficient documentation

## 2015-12-19 DIAGNOSIS — Z7984 Long term (current) use of oral hypoglycemic drugs: Secondary | ICD-10-CM | POA: Diagnosis not present

## 2015-12-19 DIAGNOSIS — R0602 Shortness of breath: Secondary | ICD-10-CM

## 2015-12-19 DIAGNOSIS — I10 Essential (primary) hypertension: Secondary | ICD-10-CM | POA: Diagnosis not present

## 2015-12-19 DIAGNOSIS — E119 Type 2 diabetes mellitus without complications: Secondary | ICD-10-CM | POA: Insufficient documentation

## 2015-12-19 DIAGNOSIS — R0789 Other chest pain: Secondary | ICD-10-CM | POA: Diagnosis present

## 2015-12-19 DIAGNOSIS — R002 Palpitations: Secondary | ICD-10-CM | POA: Insufficient documentation

## 2015-12-19 DIAGNOSIS — Z79899 Other long term (current) drug therapy: Secondary | ICD-10-CM | POA: Insufficient documentation

## 2015-12-19 DIAGNOSIS — Z6841 Body Mass Index (BMI) 40.0 and over, adult: Secondary | ICD-10-CM | POA: Insufficient documentation

## 2015-12-19 DIAGNOSIS — Z7982 Long term (current) use of aspirin: Secondary | ICD-10-CM | POA: Insufficient documentation

## 2015-12-19 LAB — HCG, SERUM, QUALITATIVE: Preg, Serum: NEGATIVE

## 2015-12-19 LAB — CBC
HEMATOCRIT: 37.6 % (ref 36.0–46.0)
HEMOGLOBIN: 11.5 g/dL — AB (ref 12.0–15.0)
MCH: 22.3 pg — ABNORMAL LOW (ref 26.0–34.0)
MCHC: 30.6 g/dL (ref 30.0–36.0)
MCV: 72.9 fL — ABNORMAL LOW (ref 78.0–100.0)
Platelets: 346 10*3/uL (ref 150–400)
RBC: 5.16 MIL/uL — ABNORMAL HIGH (ref 3.87–5.11)
RDW: 17.9 % — ABNORMAL HIGH (ref 11.5–15.5)
WBC: 9.5 10*3/uL (ref 4.0–10.5)

## 2015-12-19 LAB — BASIC METABOLIC PANEL
Anion gap: 9 (ref 5–15)
BUN: 16 mg/dL (ref 6–20)
CHLORIDE: 103 mmol/L (ref 101–111)
CO2: 24 mmol/L (ref 22–32)
CREATININE: 1.05 mg/dL — AB (ref 0.44–1.00)
Calcium: 8.9 mg/dL (ref 8.9–10.3)
GFR calc Af Amer: >60 mL/min (ref >60)
GFR calc non Af Amer: >60 mL/min (ref >60)
Glucose, Bld: 174 mg/dL — ABNORMAL HIGH (ref 65–99)
Potassium: 3.7 mmol/L (ref 3.5–5.1)
Sodium: 136 mmol/L (ref 135–145)

## 2015-12-19 LAB — TROPONIN I
Troponin I: <0.03 ng/mL (ref <0.03)
Troponin I: <0.03 ng/mL (ref <0.03)

## 2015-12-19 MED ORDER — SODIUM CHLORIDE 0.9 % IV BOLUS (SEPSIS)
1000.0000 mL | Freq: Once | INTRAVENOUS | Status: AC
  Administered 2015-12-19: 1000 mL via INTRAVENOUS

## 2015-12-19 MED ORDER — IOPAMIDOL (ISOVUE-370) INJECTION 76%
100.0000 mL | Freq: Once | INTRAVENOUS | Status: AC | PRN
  Administered 2015-12-19: 100 mL via INTRAVENOUS

## 2015-12-19 NOTE — ED Triage Notes (Signed)
Intermittent chest discomfort x several days

## 2015-12-19 NOTE — ED Notes (Signed)
PA at bedside discussing test results and dispo plan of care. 

## 2015-12-19 NOTE — ED Provider Notes (Signed)
[facility] Provider Note   CSN: [id number] Arrival date & time: 12/19/15  1745  By signing my name below, I, Phillis Haggis, attest that this documentation has been prepared under the direction and in the presence of Audry Pili, PA-C. Electronically Signed: Phillis Haggis, ED Scribe. 12/19/15. 6:32 PM.  First Provider Contact:  First MD Initiated Contact with Patient 12/19/15 1823     History   Chief Complaint Chief Complaint  Patient presents with  . Chest Pain   The history is provided by the patient. No language interpreter was used.  HPI Comments: Jane Sharp is a 45 y.o. female with a hx of DM, HTN, scleroderma, and stroke who presents to the Emergency Department complaining of intermittent chest discomfort onset 3 days ago. She reports associated SOB and "heart racing." She states that the "heart racing" became constant today. She states that the episodes of her heart racing lasts about a couple of minutes. Pt states that she vomited last night but does not think it is due to her symptoms. Pt is on 324 mg aspirin and prednisone daily. She denies fever, chills, diaphoresis, cough, nausea, vomiting, diarrhea, leg pain, leg swelling, headache, or visual disturbance. She denies hx of DVT, hx of PE, personal or family hx of heart disease, recent surgery, BC or estrogen supplement use.   PCP: Karle Plumber, MD  Past Medical History:  Diagnosis Date  . Diabetes mellitus   . Hypertension   . Scleroderma (HCC)   . Stroke [facility])     There are no active problems to display for this patient.   Past Surgical History:  Procedure Laterality Date  . CHOLECYSTECTOMY      OB History    No data available     Home Medications    Prior to Admission medications   Medication Sig Start Date End Date Taking? Authorizing Provider  aspirin 325 MG tablet Take 325 mg by mouth daily.    Historical Provider, MD  atorvastatin (LIPITOR) 20 MG tablet Take 20 mg by mouth.     Historical Provider, MD  glucose blood test strip 1 each by Other route as needed for other. Use as instructed    Historical Provider, MD  Liraglutide (VICTOZA) 18 MG/3ML SOPN Inject into the skin.    Historical Provider, MD  lisinopril-hydrochlorothiazide (PRINZIDE,ZESTORETIC) 20-12.5 MG per tablet Take 1 tablet by mouth.    Historical Provider, MD  metFORMIN (GLUCOPHAGE) 500 MG tablet Take 500 mg by mouth.    Historical Provider, MD  predniSONE (DELTASONE) 2.5 MG tablet Take 2.5 mg by mouth.    Historical Provider, MD  saxagliptin HCl (ONGLYZA) 2.5 MG TABS tablet Take 5 mg by mouth daily.    Historical Provider, MD    Family History No family history on file.  Social History Social History  Substance Use Topics  . Smoking status: Never Smoker  . Smokeless tobacco: Never Used  . Alcohol use No     Allergies   Review of patient's allergies indicates no known allergies.   Review of Systems Review of Systems A complete 10 system review of systems was obtained and all systems are negative except as noted in the HPI and PMH.   Physical Exam Updated Vital Signs BP 109/80 (BP Location: Right Arm)   Pulse 102   Temp 98.7 F (37.1 C) (Oral)   Resp 20   Ht 5' (1.524 m)   Wt 270 lb (122.5 kg)   SpO2 98%   BMI 52.73 kg/m  Physical Exam  Constitutional: She is oriented to person, place, and time. She appears well-developed and well-nourished.  Morbidly obese  HENT:  Head: Normocephalic and atraumatic.  Right Ear: Hearing normal.  Left Ear: Hearing normal.  Nose: Nose normal.  Mouth/Throat: Uvula is midline, oropharynx is clear and moist and mucous membranes are normal.  Eyes: EOM are normal. Pupils are equal, round, and reactive to light.  Left eye ptosis  Neck: Normal range of motion. Neck supple.  Cardiovascular: Regular rhythm and normal heart sounds.  Tachycardia present.  Exam reveals no gallop and no friction rub.   No murmur heard. Pulmonary/Chest: Effort normal  and breath sounds normal. She has no wheezes.  Abdominal: Soft. There is no tenderness.  Musculoskeletal: Normal range of motion.  Muscle atrophy of RLE  Neurological: She is alert and oriented to person, place, and time. She has normal strength. No cranial nerve deficit or sensory deficit.  Skin: Skin is warm and dry.  Psychiatric: She has a normal mood and affect. Her behavior is normal.  Nursing note and vitals reviewed.  ED Treatments / Results  DIAGNOSTIC STUDIES: Oxygen Saturation is 98% on RA, normal by my interpretation.    COORDINATION OF CARE: 6:32 PM-Discussed treatment plan which includes labs and EKG with pt at bedside and pt agreed to plan.   Labs (all labs ordered are listed, but only abnormal results are displayed) Labs Reviewed  CBC - Abnormal; Notable for the following:       Result Value   RBC 5.16 (*)    Hemoglobin 11.5 (*)    MCV 72.9 (*)    MCH 22.3 (*)    RDW 17.9 (*)    All other components within normal limits  BASIC METABOLIC PANEL - Abnormal; Notable for the following:    Glucose, Bld 174 (*)    Creatinine, Ser 1.05 (*)    All other components within normal limits  TROPONIN I  HCG, SERUM, QUALITATIVE  TROPONIN I   EKG  EKG Interpretation  Date/Time:  Thursday December 19 2015 17:52:49 EDT Ventricular Rate:  98 PR Interval:  134 QRS Duration: 72 QT Interval:  336 QTC Calculation: 428 R Axis:   50 Text Interpretation:  Sinus arrhythmia Otherwise no significant change Confirmed by Adela Lank MD, DANIEL [phone]) on 12/19/2015 6:14:50 PM       Radiology Dg Chest 2 View  Result Date: 12/19/2015 CLINICAL DATA:  Upper central chest pain for 2 days. EXAM: CHEST  2 VIEW COMPARISON:  None. FINDINGS: The lungs are clear wiithout focal pneumonia, edema, pneumothorax or pleural effusion. The cardiopericardial silhouette is within normal limits for size. Thoracolumbar scoliosis noted. The visualized bony structures of the thorax are intact. Telemetry leads  overlie the chest. IMPRESSION: No acute cardiopulmonary findings. Electronically Signed   By: Kennith Center M.D.   On: 12/19/2015 19:27  Ct Angio Chest Pe W And/or Wo Contrast  Result Date: 12/19/2015 CLINICAL DATA:  Chest palpitations for the past 3 days. Evaluate for pulmonary embolism. EXAM: CT ANGIOGRAPHY CHEST WITH CONTRAST TECHNIQUE: Multidetector CT imaging of the chest was performed using the standard protocol during bolus administration of intravenous contrast. Multiplanar CT image reconstructions and MIPs were obtained to evaluate the vascular anatomy. CONTRAST:  100 cc Isovue 370 COMPARISON:  Chest radiograph - 12/19/2015 CT the abdomen pelvis -01/01/2014 FINDINGS: Vascular Findings: There is adequate opacification of the pulmonary arterial system with the main pulmonary artery measuring 335 Hounsfield units. There are no discrete filling defects within the  pulmonary arterial tree to the level of the bilateral subsegmental pulmonary arteries. Evaluation of the distal subsegmental pulmonary arteries is degraded secondary to a combination of patient respiratory artifact and quantum mottle artifact due to patient body habitus. Normal caliber of the main pulmonary artery. Normal heart size. No pericardial effusion. Normal caliber of the thoracic aorta. No definite thoracic aortic dissection on this nongated examination. Bovine configuration of the aortic arch. The branch vessels of the aortic arch appear widely patent throughout their imaged course. Review of the MIP images confirms the above findings. ---------------------------------------------------------------------------------- Nonvascular Findings: Mediastinum/Lymph Nodes: No bulky mediastinal, hilar axillary lymphadenopathy. Lungs/Pleura: Evaluation of the pulmonary parenchyma is degraded secondary to a combination of patient respiratory artifact as well as quantum mottle artifact due to patient body habitus. Perihilar predominant ground-glass  opacities favored to represent atelectasis. There is partial atelectasis / collapse of the right lower lobe. No discrete focal airspace opacities. No pleural effusion or pneumothorax. The central pulmonary airways appear widely patent. No discrete pulmonary nodules given limitation of the examination. Upper abdomen: Limited early arterial phase evaluation of the upper abdomen demonstrates an approximately 3.4 x (at least) 3.5 cm hypo attenuating cystic lesion about the greater curvature of the mid body of the stomach (coronal image 27, series 7, axial image 72, series 4), similar to the 12/2013 examination. Incidental note is made of a small splenule. Musculoskeletal: Mild-to-moderate rotatory scoliotic curvature of the thoracolumbar spine. No acute or aggressive osseous abnormalities. Regional soft tissues appear normal. Normal appearance of the thyroid gland. IMPRESSION: 1. No acute cardiopulmonary disease on this motion degraded examination. Specifically, no evidence of pulmonary embolism. 2. 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 Further evaluation with dedicated nonemergent abdominal CT could be performed as clinically indicated. Electronically Signed   By: Simonne Come M.D.   On: 12/19/2015 21:36   Procedures Procedures (including critical care time)  Medications Ordered in ED Medications - No data to display   Initial Impression / Assessment and Plan / ED Course  I have reviewed the triage vital signs and the nursing notes.  Pertinent labs & imaging results that were available during my care of the patient were reviewed by me and considered in my medical decision making (see chart for details).  Clinical Course    Final Clinical Impressions(s) / ED Diagnoses  I have  reviewed and evaluated the relevant laboratory values I have reviewed and evaluated the relevant imaging studies.  I have interpreted the relevant EKG. I have reviewed the relevant previous healthcare records. I obtained HPI from historian. Patient discussed with supervising physician  ED Course:  Assessment: Pt is a 44yF with hx Scleroderma, DM, HTN, Stroke who presents with shortness of breath and palpitations x 3 days. On exam, pt in NAD. Nontoxic/nonseptic appearing. No tracheal deviation, no JVD or new murmur, RRR, breath sounds equal bilaterally. VS stable except for slight tachycardia. Afebrile. Lungs CTA. Abdomen nontender soft. CBC/BMP unremarkable. Trop negative x2. EKG NSR and unremarkable. Concern for PE due to tachycardia, immobility, and shortness of breath. CT Angio negative for PE. Given NS bolus with improvement of symptoms. Plan is to DC home with follow up to PCP. At time of discharge, Patient is in no acute distress. Vital Signs are stable. Patient is able to ambulate. Patient able to tolerate PO.  Disposition/Plan:  DC Home Additional Verbal discharge instructions given and discussed with patient.  Pt Instructed to f/u with PCP in the next week for evaluation and treatment of symptoms. Return precautions given Pt acknowledges and agrees with plan  Supervising Physician Melene Plan, DO   Final diagnoses:  SOB (shortness of breath)  Palpitations    I personally performed the services described in this documentation, which was scribed in my presence. The recorded information has been reviewed and is accurate.   New Prescriptions New Prescriptions   No medications on file     Audry Pili, PA-C 12/19/15 2228    Melene Plan, DO 12/19/15 2307

## 2015-12-19 NOTE — Discharge Instructions (Signed)
Please read and follow all provided instructions.  Your diagnoses today include:  1. Palpitations   2. SOB (shortness of breath)    Tests performed today include: An EKG of your heart A chest x-ray Cardiac enzymes - a blood test for heart muscle damage Blood counts and electrolytes Vital signs. See below for your results today.   Medications prescribed:   Take any prescribed medications only as directed.  Follow-up instructions: Please follow-up with your primary care provider as soon as you can for further evaluation of your symptoms.   Return instructions:  SEEK IMMEDIATE MEDICAL ATTENTION IF: You have severe chest pain, especially if the pain is crushing or pressure-like and spreads to the arms, back, neck, or jaw, or if you have sweating, nausea (feeling sick to your stomach), or shortness of breath. THIS IS AN EMERGENCY. Don't wait to see if the pain will go away. Get medical help at once. Call 911 or [phone] (operator). DO NOT drive yourself to the hospital.  Your chest pain gets worse and does not go away with rest.  You have an attack of chest pain lasting longer than usual, despite rest and treatment with the medications your caregiver has prescribed.  You wake from sleep with chest pain or shortness of breath. You feel dizzy or faint. You have chest pain not typical of your usual pain for which you originally saw your caregiver.  You have any other emergent concerns regarding your health.  Additional Information: Chest pain comes from many different causes. Your caregiver has diagnosed you as having chest pain that is not specific for one problem, but does not require admission.  You are at low risk for an acute heart condition or other serious illness.   Your vital signs today were: BP 97/69    Pulse 89    Temp 98.7 F (37.1 C) (Oral)    Resp 25    Ht 5' (1.524 m)    Wt 122.5 kg    LMP 12/09/2015    SpO2 98%    BMI 52.73 kg/m  If your blood pressure (BP) was elevated above  135/85 this visit, please have this repeated by your doctor within one month. --------------

## 2015-12-19 NOTE — ED Notes (Signed)
Patient transported to X-ray 

## 2018-03-03 ENCOUNTER — Other Ambulatory Visit: Payer: Self-pay

## 2018-03-03 ENCOUNTER — Emergency Department (HOSPITAL_BASED_OUTPATIENT_CLINIC_OR_DEPARTMENT_OTHER)
Admission: EM | Admit: 2018-03-03 | Discharge: 2018-03-03 | Disposition: A | Discharge status: Home / Self Care | Payer: BC Managed Care – PPO | Attending: Emergency Medicine | Admitting: Emergency Medicine

## 2018-03-03 ENCOUNTER — Encounter (HOSPITAL_BASED_OUTPATIENT_CLINIC_OR_DEPARTMENT_OTHER): Payer: Self-pay | Admitting: Emergency Medicine

## 2018-03-03 DIAGNOSIS — E119 Type 2 diabetes mellitus without complications: Secondary | ICD-10-CM | POA: Diagnosis not present

## 2018-03-03 DIAGNOSIS — Z7982 Long term (current) use of aspirin: Secondary | ICD-10-CM | POA: Insufficient documentation

## 2018-03-03 DIAGNOSIS — Z79899 Other long term (current) drug therapy: Secondary | ICD-10-CM | POA: Diagnosis not present

## 2018-03-03 DIAGNOSIS — I10 Essential (primary) hypertension: Secondary | ICD-10-CM | POA: Insufficient documentation

## 2018-03-03 DIAGNOSIS — L245 Irritant contact dermatitis due to other chemical products: Secondary | ICD-10-CM | POA: Diagnosis not present

## 2018-03-03 DIAGNOSIS — Z8673 Personal history of transient ischemic attack (TIA), and cerebral infarction without residual deficits: Secondary | ICD-10-CM | POA: Insufficient documentation

## 2018-03-03 DIAGNOSIS — H11432 Conjunctival hyperemia, left eye: Secondary | ICD-10-CM | POA: Diagnosis present

## 2018-03-03 MED ORDER — TRIAMCINOLONE ACETONIDE 0.1 % EX CREA: 1.0000 "application " | TOPICAL_CREAM | Freq: Two times a day (BID) | CUTANEOUS | 0 refills | Status: AC

## 2018-03-03 MED FILL — TRIAMCINOLONE 0.1% CREAM: 0.1 | 15 days supply | Qty: 30 | Fill #0

## 2018-03-03 NOTE — Discharge Instructions (Addendum)
You were seen in the emergency department for a burn/allergic reaction to your right eye.  Please stop using the Neosporin.  Continue the cool compress 3 or 4 times a day.  Gentle soap and water.  We are prescribing a triamcinolone to use twice a day.  You should use it is no longer than a week.  Please follow-up with your doctor and return if any worsening symptoms.

## 2018-03-03 NOTE — ED Notes (Signed)
ED Provider at bedside. 

## 2018-03-03 NOTE — ED Triage Notes (Signed)
Pt states Hair Permed on saturday, Monday noticed worsening of swelling around right eye.

## 2018-03-03 NOTE — ED Provider Notes (Signed)
[facility] EMERGENCY DEPARTMENT Provider Note   CSN: [id number] Arrival date & time: 03/03/18  0805     History   Chief Complaint Chief Complaint  Patient presents with  . Allergic Reaction    HPI Jane Sharp is a 47 y.o. female.  She is here complaining of some right eye swelling redness and fluid leaking after she received a burn from getting a perm on her hair.  She had a perm on Saturday and then Monday she noticed the swelling in the itchiness around the eye.  She is been trying some topical Neosporin without relief.  There is no eye pain or blurry vision or other visual symptoms.  She has no other rash and no shortness of breath or difficulty swallowing or speaking.  She has some lid lag on the left side but she attributes that to scleroderma in his baseline.  The history is provided by the patient.  Allergic Reaction  Presenting symptoms: itching, rash and swelling   Presenting symptoms: no difficulty breathing, no difficulty swallowing and no wheezing   Severity:  Moderate Prior allergic episodes:  No prior episodes Context: chemicals   Relieved by:  Cold compresses Worsened by:  Nothing   Past Medical History:  Diagnosis Date  . Diabetes mellitus   . Hypertension   . Scleroderma (HCC)   . Stroke [facility])     There are no active problems to display for this patient.   Past Surgical History:  Procedure Laterality Date  . CHOLECYSTECTOMY       OB History   None      Home Medications    Prior to Admission medications   Medication Sig Start Date End Date Taking? Authorizing Provider  Liraglutide (VICTOZA) 18 MG/3ML SOPN Inject into the skin.   Yes [provider]  aspirin 325 MG tablet Take 325 mg by mouth daily.    [provider]  atorvastatin (LIPITOR) 20 MG tablet Take 20 mg by mouth.    [provider]  glucose blood test strip 1 each by Other route as needed for other. Use as instructed    [provider]  lisinopril-hydrochlorothiazide (PRINZIDE,ZESTORETIC) 20-12.5 MG per tablet Take 1 tablet by mouth.    [provider]  metFORMIN (GLUCOPHAGE) 500 MG tablet Take 500 mg by mouth.    [provider]  predniSONE (DELTASONE) 2.5 MG tablet Take 2.5 mg by mouth.    [provider]  saxagliptin HCl (ONGLYZA) 2.5 MG TABS tablet Take 5 mg by mouth daily.    [provider]    Family History No family history on file.  Social History Social History   Tobacco Use  . Smoking status: Never Smoker  . Smokeless tobacco: Never Used  Substance Use Topics  . Alcohol use: No  . Drug use: No     Allergies   Patient has no known allergies.   Review of Systems Review of Systems  Constitutional: Negative for fever.  HENT: Negative for sore throat and trouble swallowing.   Eyes: Positive for itching. Negative for photophobia and visual disturbance.  Respiratory: Negative for shortness of breath and wheezing.   Cardiovascular: Negative for chest pain.  Gastrointestinal: Negative for abdominal pain.  Genitourinary: Negative for dysuria.  Musculoskeletal: Negative for neck pain.  Skin: Positive for itching and rash.  Neurological: Negative for headaches.     Physical Exam Updated Vital Signs BP 120/87 (BP Location: Right Arm)   Pulse 81   Temp  98 F (36.7 C) (Oral)   Resp 16   Ht 5' (1.524 m)   Wt 121.6 kg   SpO2 98%   BMI 52.34 kg/m   Physical Exam  Constitutional: She appears well-developed and well-nourished.  HENT:  Head: Normocephalic and atraumatic.  Right Ear: External ear normal.  Left Ear: External ear normal.  Nose: Nose normal.  Mouth/Throat: Oropharynx is clear and moist.  She is a little edema of her lower lid.  At her lateral canthus there is some weeping slightly reddened skin.  Pupils equal round reactive to light extraocular movements intact conjunctiva clear.  Anterior chamber clear and deep.  Eyes: Pupils are equal, round,  and reactive to light. Conjunctivae and EOM are normal.  Neck: Neck supple.  Cardiovascular: Normal rate, regular rhythm and normal heart sounds.  Pulmonary/Chest: Effort normal. No stridor. She has no wheezes. She has no rales.  Musculoskeletal: She exhibits no tenderness or deformity.  Neurological: She is alert. GCS eye subscore is 4. GCS verbal subscore is 5. GCS motor subscore is 6.  Skin: Skin is warm and dry. Capillary refill takes less than 2 seconds.  Psychiatric: She has a normal mood and affect.  Nursing note and vitals reviewed.    ED Treatments / Results  Labs (all labs ordered are listed, but only abnormal results are displayed) Labs Reviewed - No data to display  EKG None  Radiology No results found.  Procedures Procedures (including critical care time)  Medications Ordered in ED Medications - No data to display   Initial Impression / Assessment and Plan / ED Course  I have reviewed the triage vital signs and the nursing notes.  Pertinent labs & imaging results that were available during my care of the patient were reviewed by me and considered in my medical decision making (see chart for details).  Clinical Course as of Mar 03 1800  Thu Mar 03, 2018  5931 47 year old female here with what sounds like a chemical burn/contact dermatitis around her left eye at the lateral canthus.  Does not appear to involve the conjunctiva.  Recommended to her cool compress and will try her on some potency topical steroid.  She understands just to use this 2 times a day and for less than a week.  I also asked her to stop the Neosporin as that may be worsening this.   [MB]    Clinical Course User Index [MB] Terrilee Files, MD     Final Clinical Impressions(s) / ED Diagnoses   Final diagnoses:  Irritant contact dermatitis due to other chemical products    ED Discharge Orders         Ordered    triamcinolone cream (KENALOG) 0.1 %  2 times daily     03/03/18 0826             Terrilee Files, MD 03/03/18 [phone]

## 2018-04-08 IMAGING — CT CT ANGIO CHEST
2 of 8 series · 17 of 36 positions shown · IV contrast (isovue)
Comparison: Chest radiograph - 12/19/2015 CT the abdomen pelvis
-01/01/2014

CLINICAL DATA: Chest palpitations for the past 3 days. Evaluate for
pulmonary embolism.

EXAM:
CT ANGIOGRAPHY CHEST WITH CONTRAST
TECHNIQUE: Multidetector CT imaging of the chest was performed using the
standard protocol during bolus administration of intravenous
contrast. Multiplanar CT image reconstructions and MIPs were
obtained to evaluate the vascular anatomy.
CONTRAST:  100 cc Isovue 370

[Series 6: pe thins · axial · 0.61mm/px · z∈[-359,-154]mm · 16 of 305 slices shown]
[im 16/305  lung]
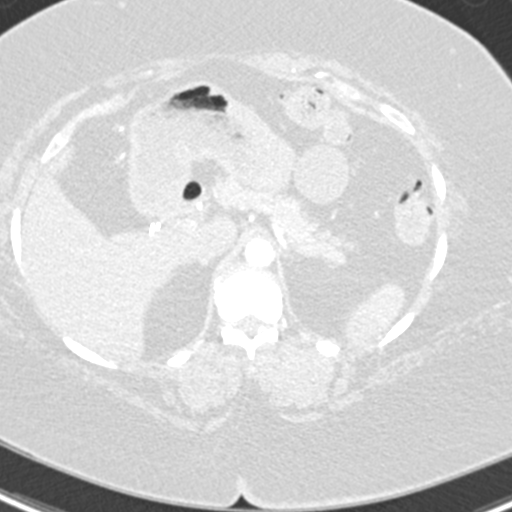
[im 31/305  mediastinal]
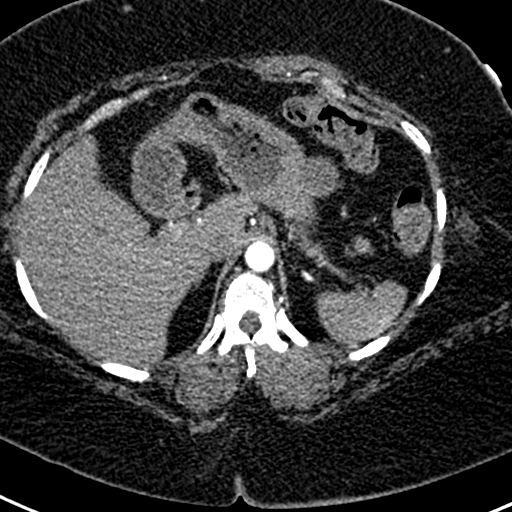
[im 46/305  lung]
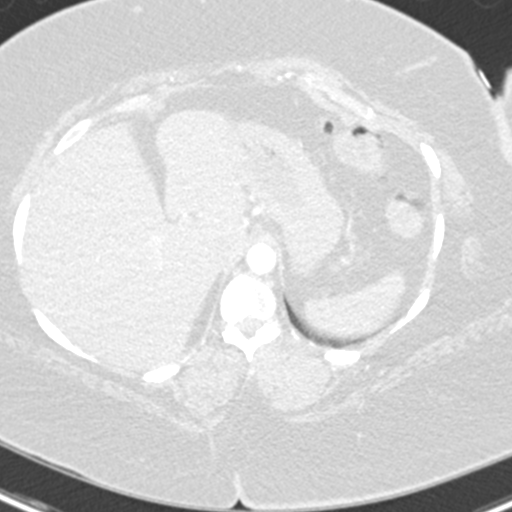
[im 77/305  mediastinal]
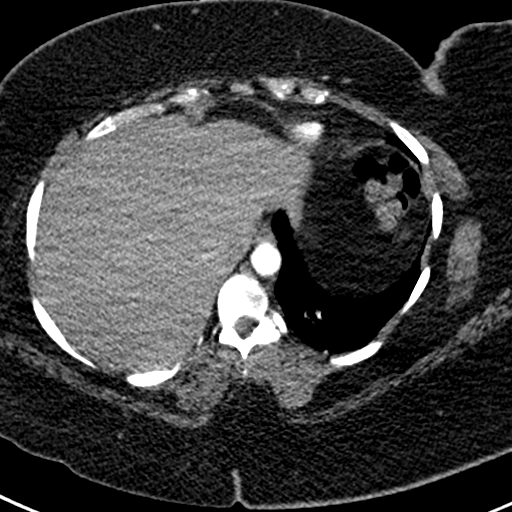
[im 92/305  lung]
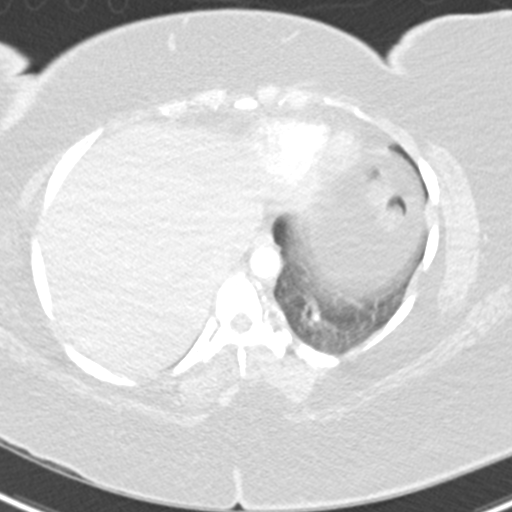
[im 107/305  mediastinal]
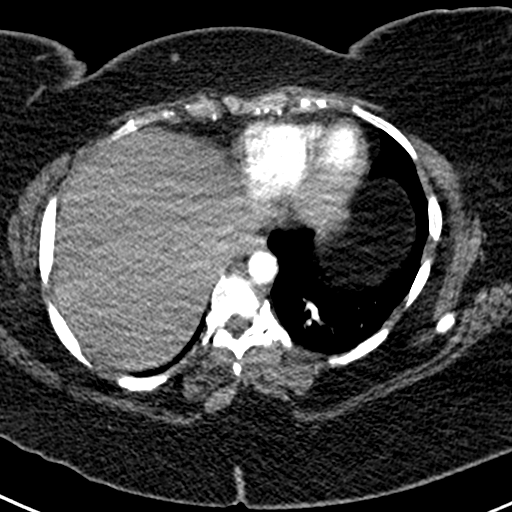
[im 122/305  lung]
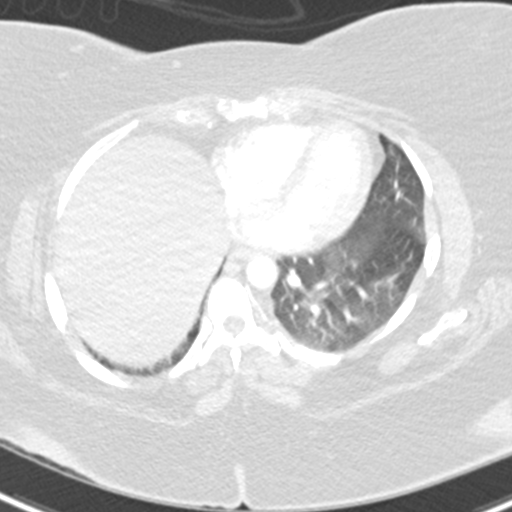
[im 137/305  mediastinal]
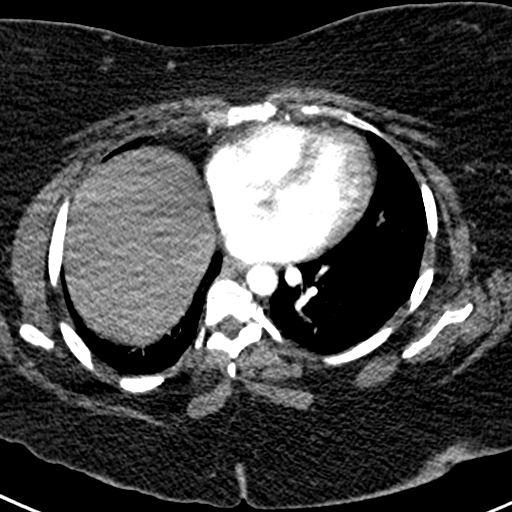
[im 168/305  lung]
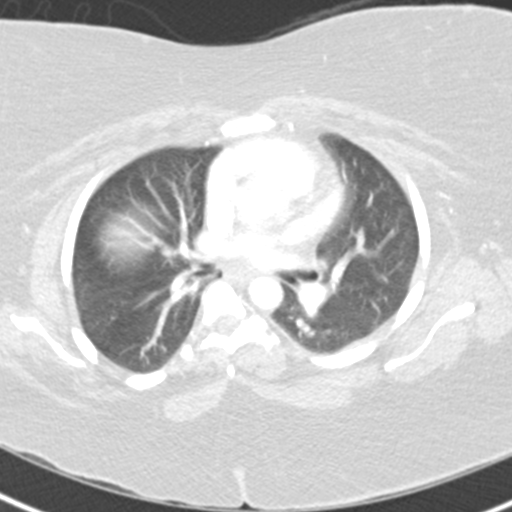
[im 183/305  mediastinal]
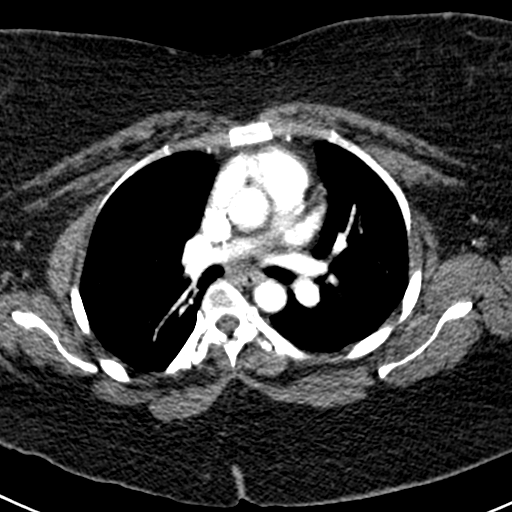
[im 198/305  lung]
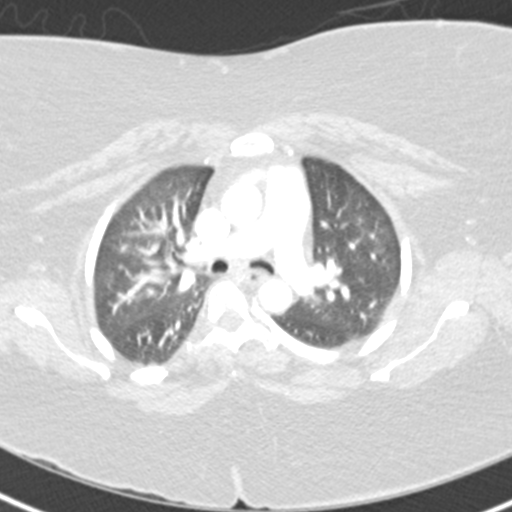
[im 213/305  mediastinal]
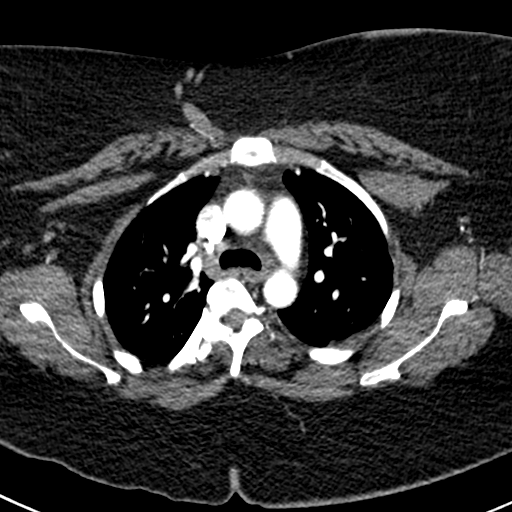
[im 229/305  lung]
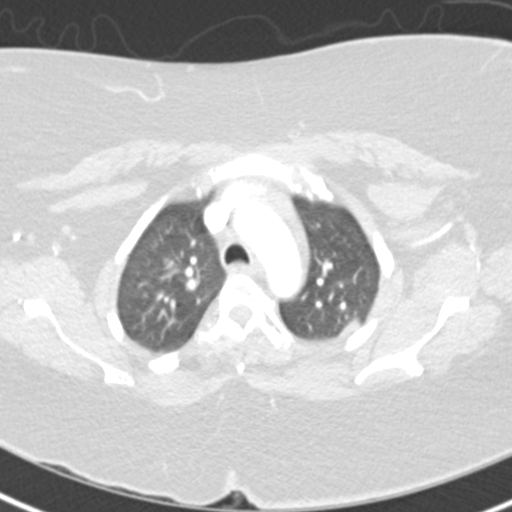
[im 259/305  mediastinal]
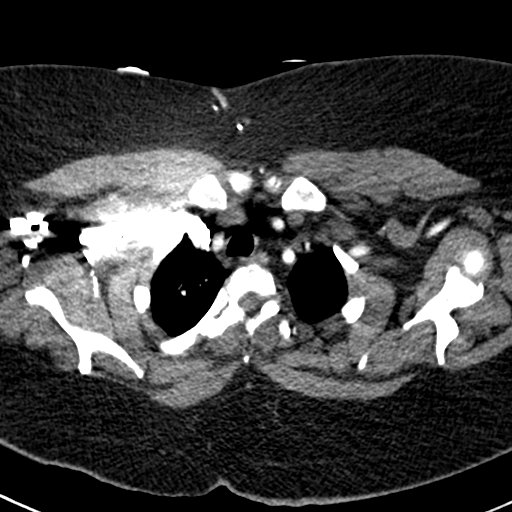
[im 274/305  lung]
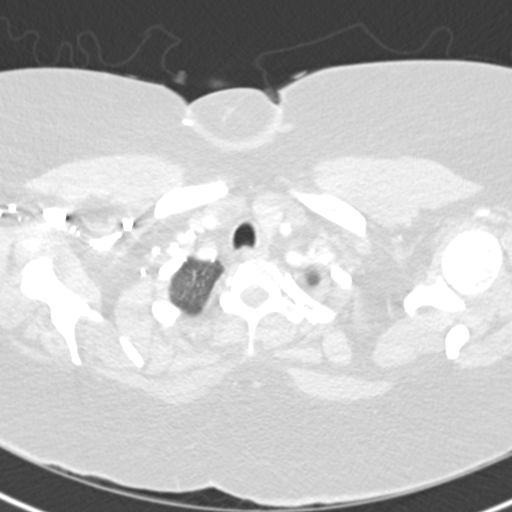
[im 289/305  mediastinal]
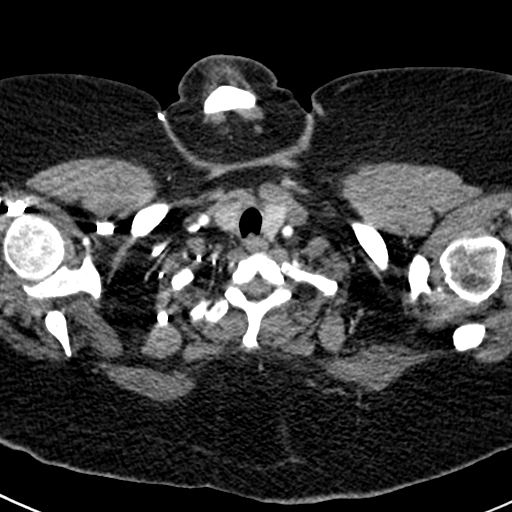

[Series 7: pe coronal mpr · coronal · 0.56mm/px · 1 of 71 slices shown]
[im 36/71  mediastinal]
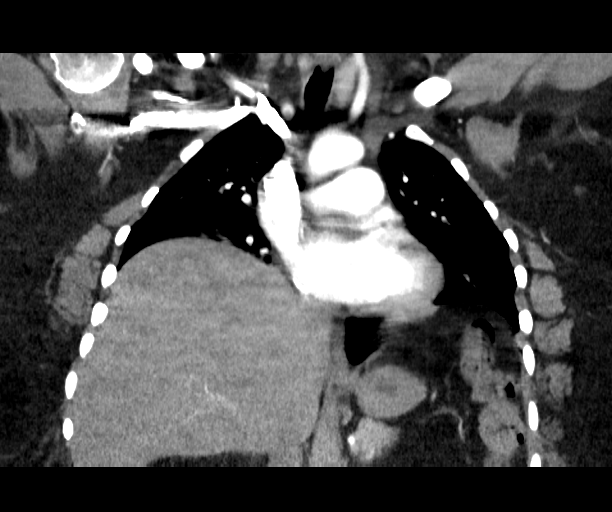

[17 of 36 positions shown; findings below may reference images not displayed]

FINDINGS: Vascular Findings:

There is adequate opacification of the pulmonary arterial system
with the main pulmonary artery measuring 335 Hounsfield units. There
are no discrete filling defects within the pulmonary arterial tree
to the level of the bilateral subsegmental pulmonary arteries.
Evaluation of the distal subsegmental pulmonary arteries is degraded
secondary to a combination of patient respiratory artifact and
quantum mottle artifact due to patient body habitus. Normal caliber
of the main pulmonary artery.

Normal heart size. No pericardial effusion. Normal caliber of the
thoracic aorta. No definite thoracic aortic dissection on this
nongated examination. Bovine configuration of the aortic arch. The
branch vessels of the aortic arch appear widely patent throughout
their imaged course.

Review of the MIP images confirms the above findings.

----------------------------------------------------------------------------------

Nonvascular Findings:

Mediastinum/Lymph Nodes: No bulky mediastinal, hilar axillary
lymphadenopathy.

Lungs/Pleura: Evaluation of the pulmonary parenchyma is degraded
secondary to a combination of patient respiratory artifact as well
as quantum mottle artifact due to patient body habitus.

Perihilar predominant ground-glass opacities favored to represent
atelectasis. There is partial atelectasis / collapse of the right
lower lobe. No discrete focal airspace opacities. No pleural
effusion or pneumothorax. The central pulmonary airways appear
widely patent.

No discrete pulmonary nodules given limitation of the examination.

Upper abdomen: Limited early arterial phase evaluation of the upper
abdomen demonstrates an approximately 3.4 x (at least) 3.5 cm hypo
attenuating cystic lesion about the greater curvature of the mid
body of the stomach (coronal image 27, series 7, axial image 72,
series 4), similar to the [DATE] examination.

Incidental note is made of a small splenule.

Musculoskeletal: Mild-to-moderate rotatory scoliotic curvature of
the thoracolumbar spine. No acute or aggressive osseous
abnormalities.

Regional soft tissues appear normal. Normal appearance of the
thyroid gland.
IMPRESSION: 1. No acute cardiopulmonary disease on this motion degraded
examination. Specifically, no evidence of pulmonary embolism.
2. Grossly unchanged size and appearance of the approximately 3.5 cm
cystic structure about the greater curvature the mid body of the
stomach, incompletely imaged and characterized on the present
examination though favored to represent an enteric duplication cyst.
Further evaluation with dedicated nonemergent abdominal CT could be
performed as clinically indicated.

## 2018-04-08 IMAGING — DX DG CHEST 2V
2 series · 2 of 2 positions shown · non-contrast
Comparison: None.

CLINICAL DATA: Upper central chest pain for 2 days.

EXAM:
CHEST  2 VIEW

[chest pa]
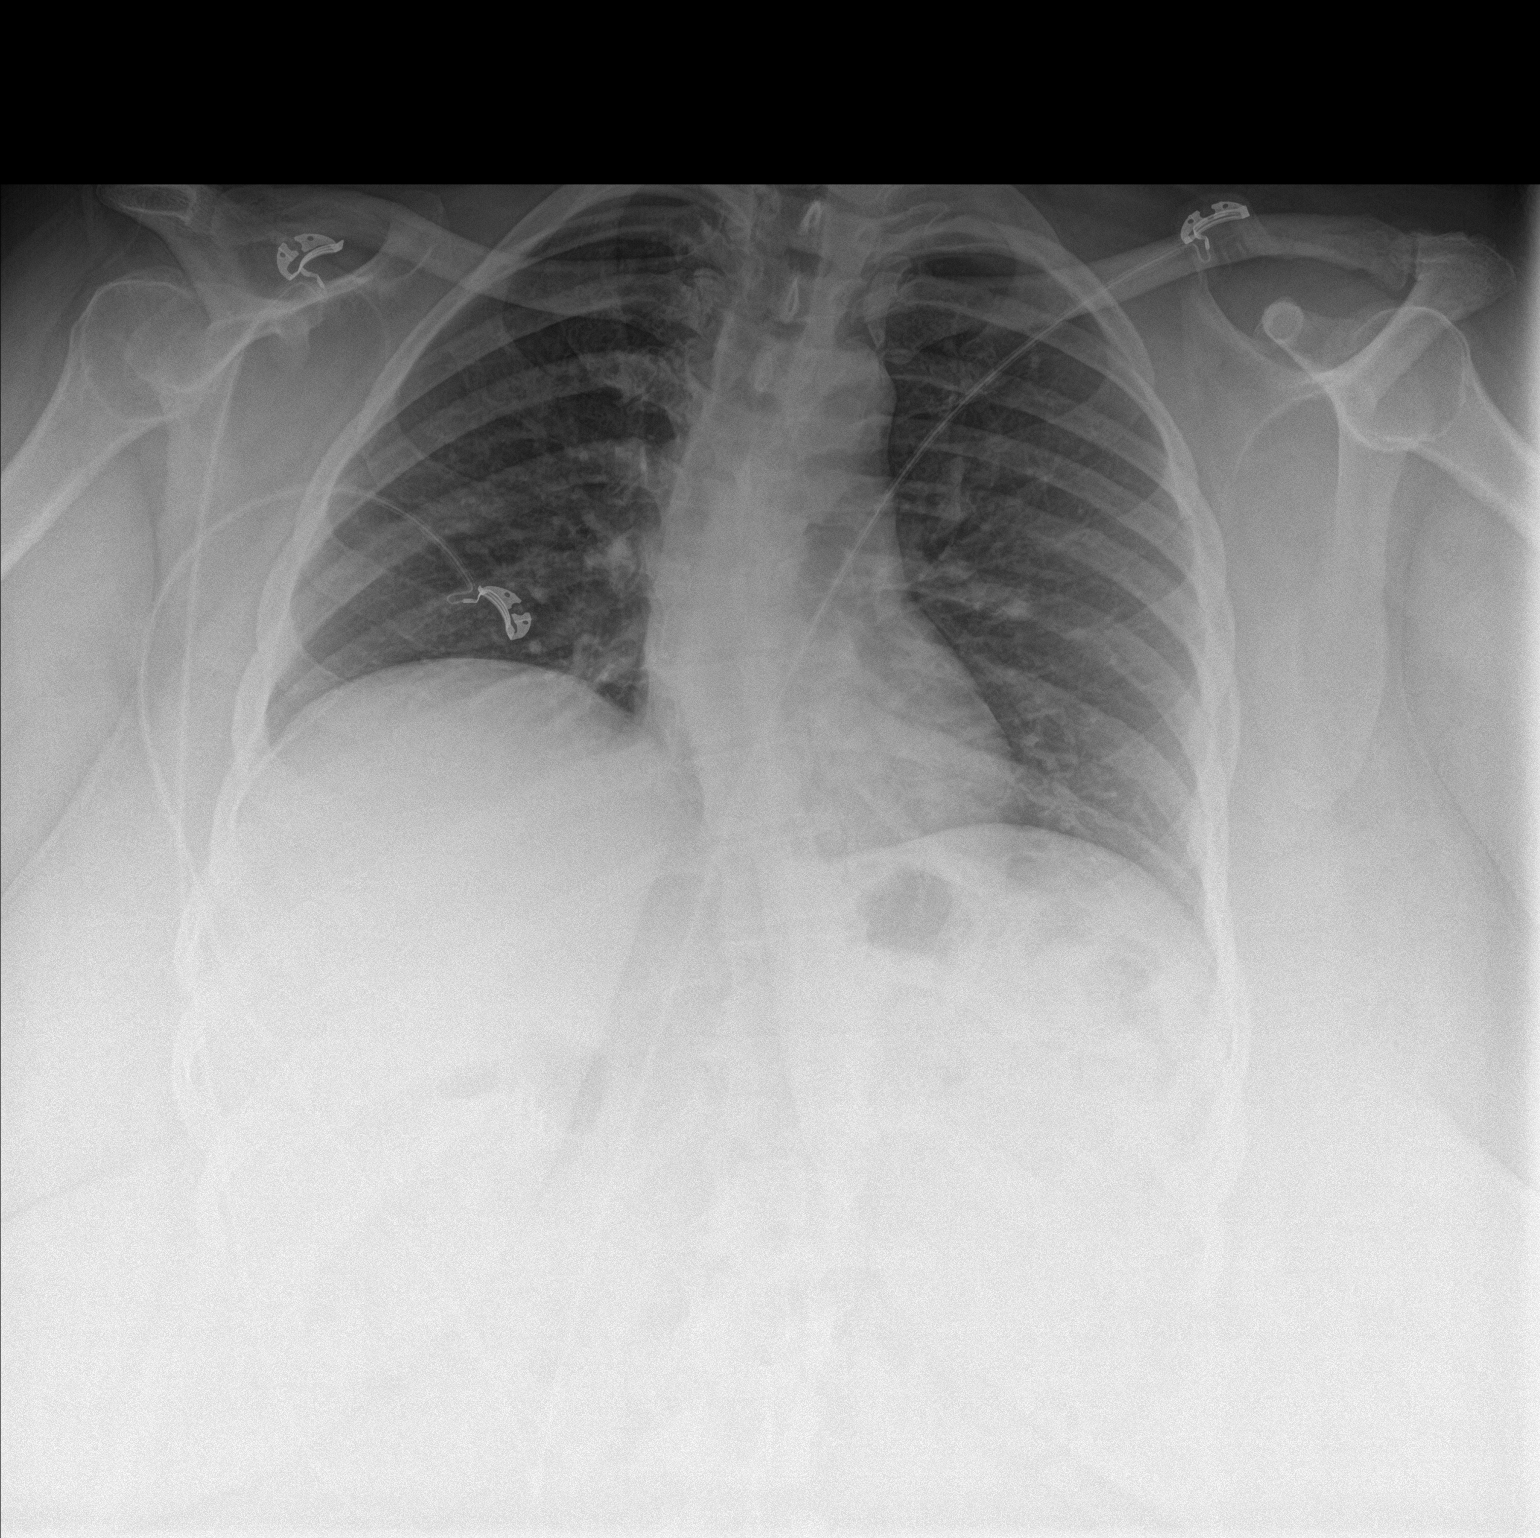

[chest lat]
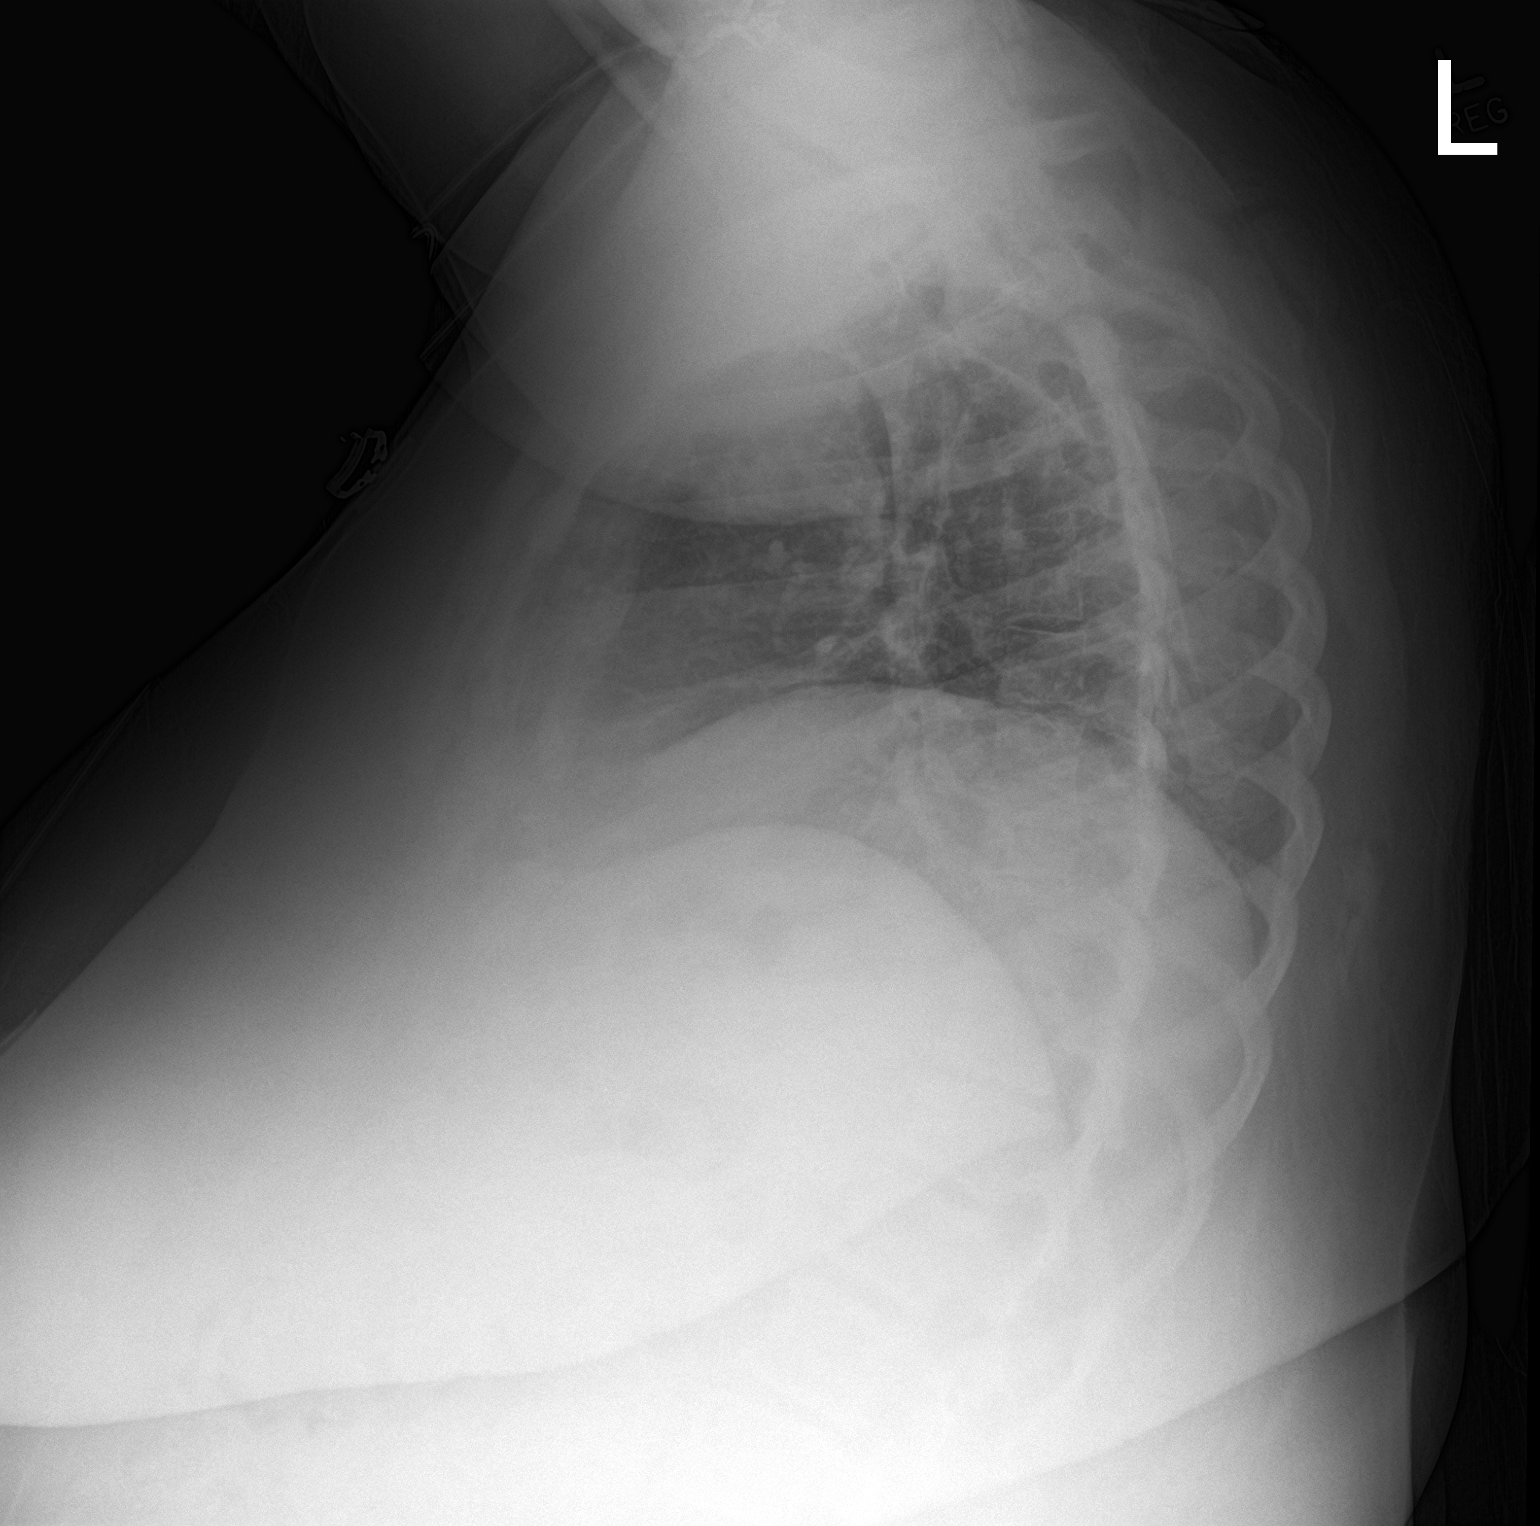

[2 of 2 positions shown; findings below may reference images not displayed]

FINDINGS: The lungs are clear wiithout focal pneumonia, edema, pneumothorax or
pleural effusion. The cardiopericardial silhouette is within normal
limits for size. Thoracolumbar scoliosis noted. The visualized bony
structures of the thorax are intact. Telemetry leads overlie the
chest.
IMPRESSION: No acute cardiopulmonary findings.
